# Patient Record
Sex: Female | Born: 1970 | Race: White | Hispanic: No | Marital: Single | Smoking: Current every day smoker
Health system: Southern US, Community
[De-identification: ages and names within clinical notes are randomized; demographics above are authoritative.]

## PROBLEM LIST (undated history)

## (undated) DIAGNOSIS — B182 Chronic viral hepatitis C: Secondary | ICD-10-CM

## (undated) DIAGNOSIS — C801 Malignant (primary) neoplasm, unspecified: Secondary | ICD-10-CM

## (undated) HISTORY — PX: CHOLECYSTECTOMY: SHX55

## (undated) HISTORY — DX: Chronic viral hepatitis C: B18.2

## (undated) HISTORY — DX: Malignant (primary) neoplasm, unspecified: C80.1

---

## 2007-04-18 ENCOUNTER — Ambulatory Visit: Payer: Self-pay | Admitting: Internal Medicine

## 2007-04-21 ENCOUNTER — Ambulatory Visit: Payer: Self-pay | Admitting: Surgery

## 2008-04-25 IMAGING — US ABDOMEN ULTRASOUND
1 series · 17 of 25 positions shown · non-contrast
Comparison: none

REASON FOR EXAM: CALL [id number]   abd pain  attn gallbladder
COMMENTS:

[Series 1: abdomen ultrasound · 17 of 61 slices shown]
[im 1/61]
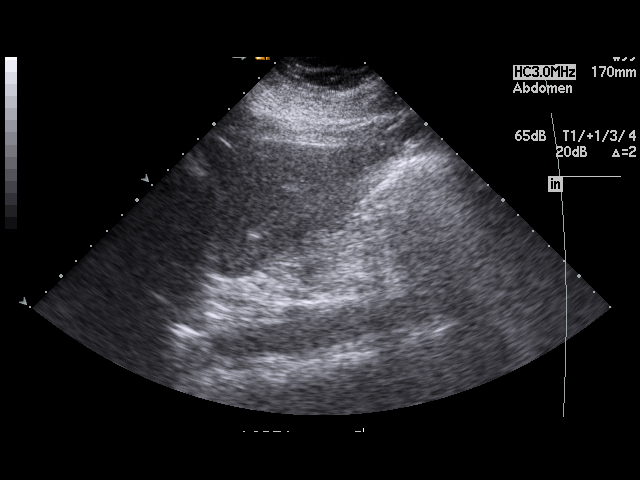
[im 6/61]
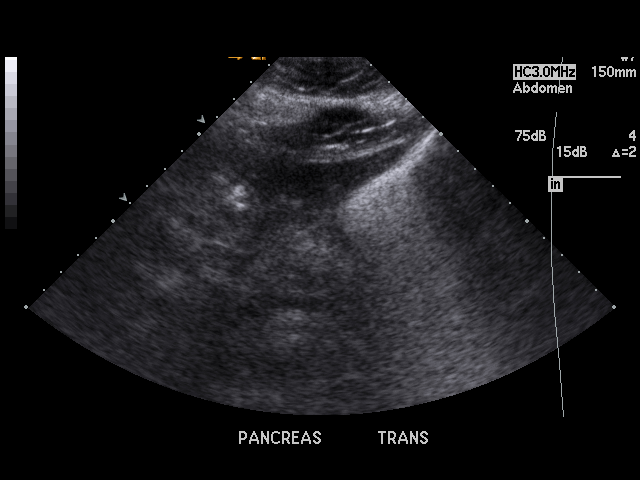
[im 8/61]
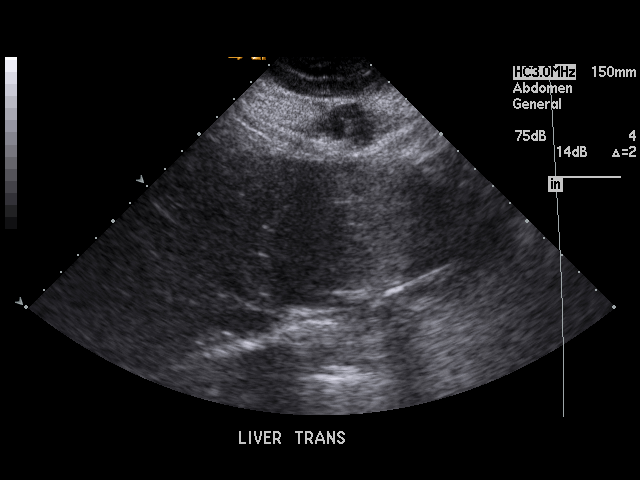
[im 13/61]
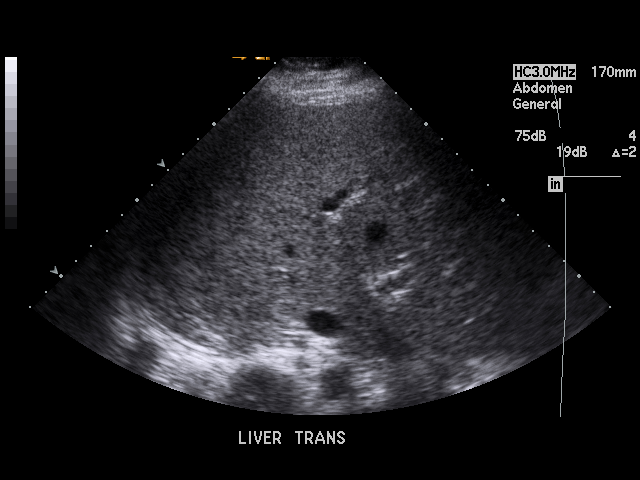
[im 16/61]
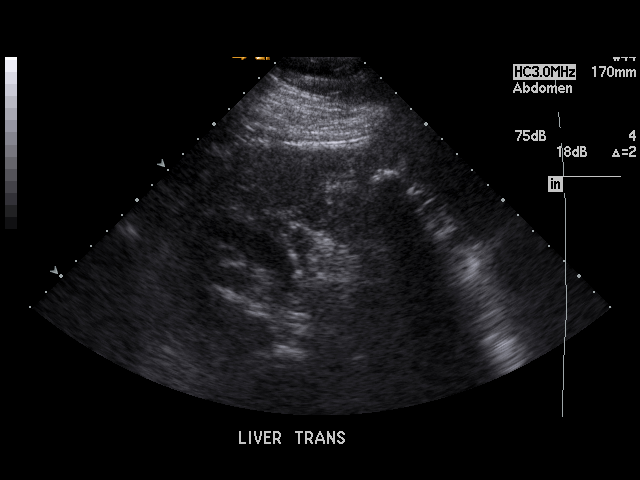
[im 21/61]
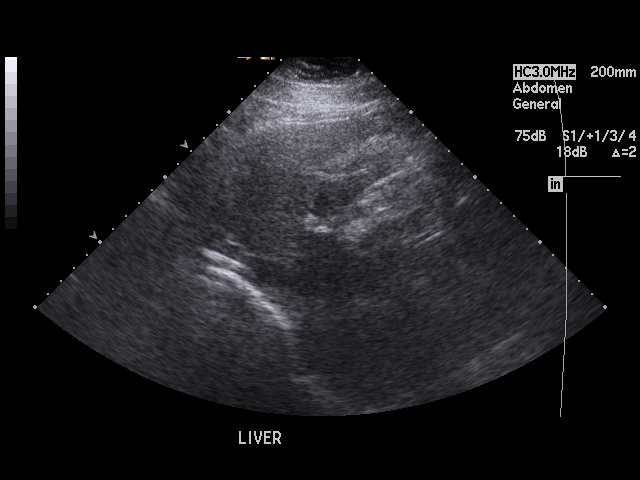
[im 23/61]
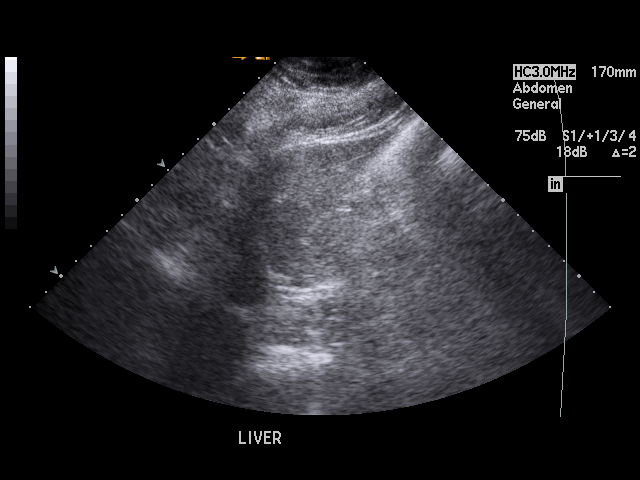
[im 28/61]
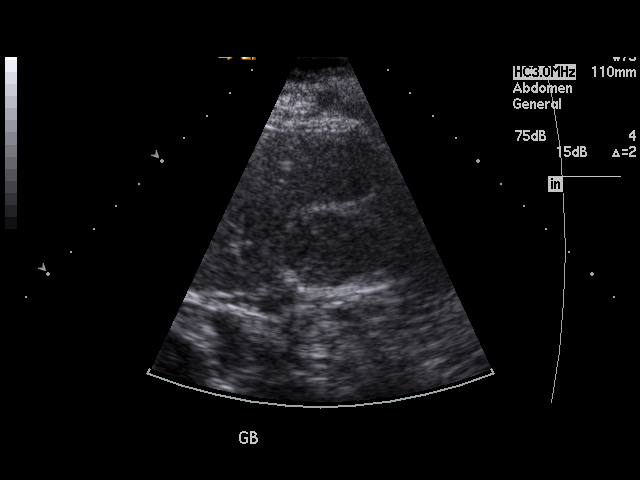
[im 31/61]
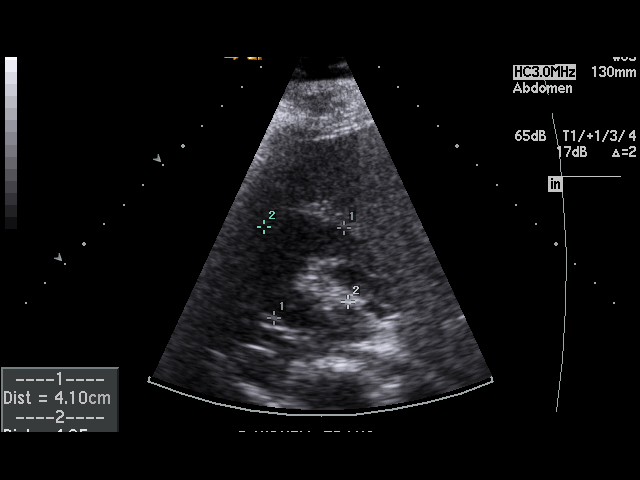
[im 33/61]
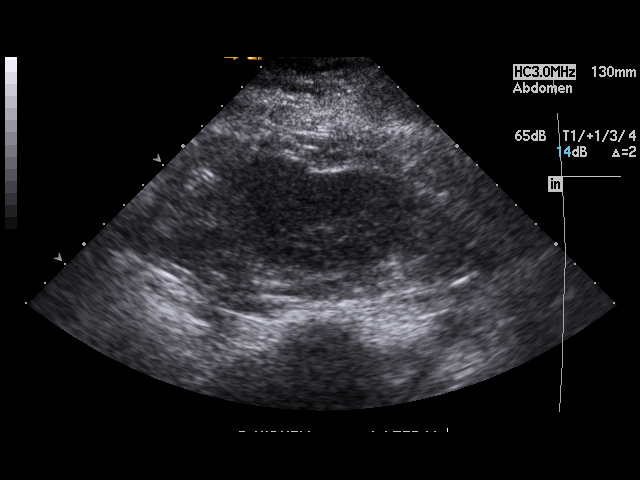
[im 38/61]
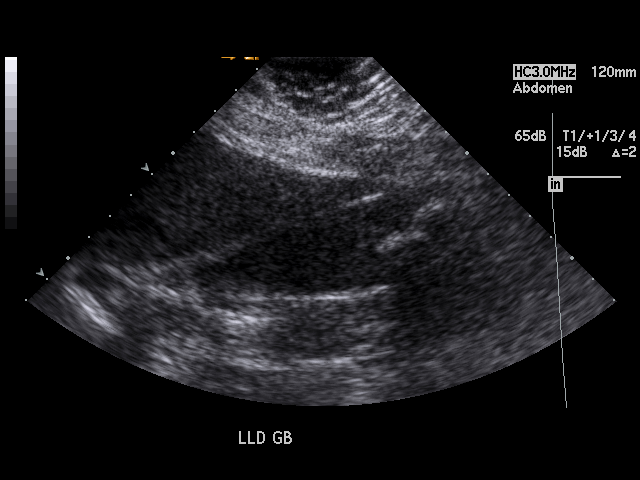
[im 41/61]
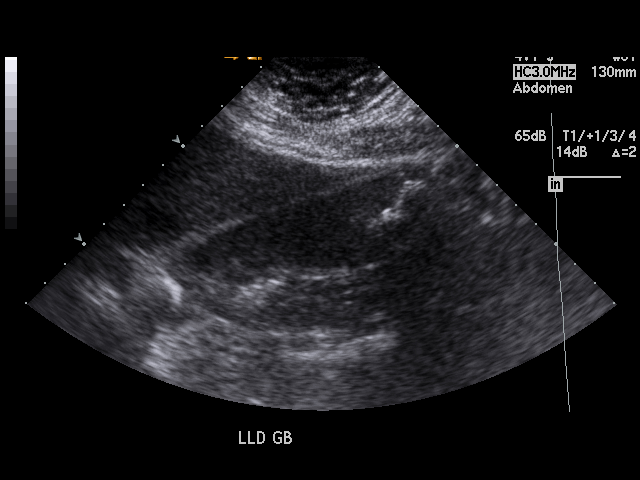
[im 46/61]
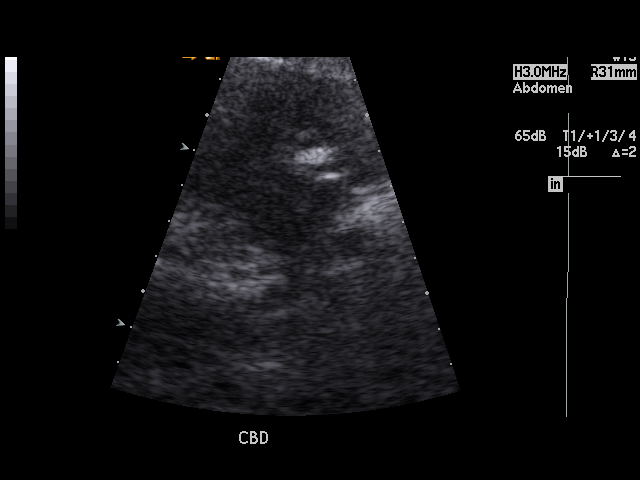
[im 48/61]
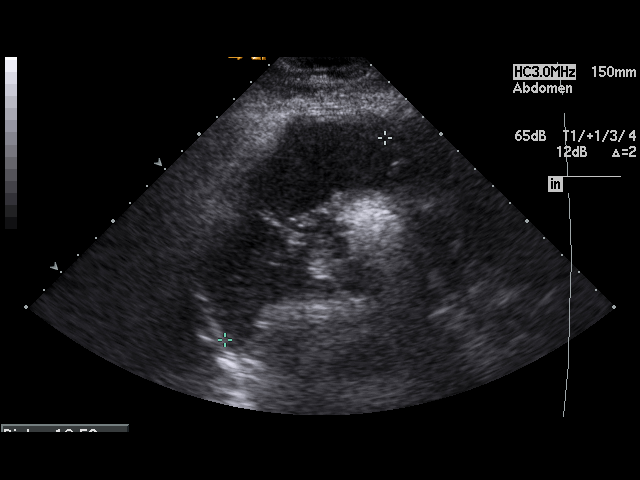
[im 53/61]
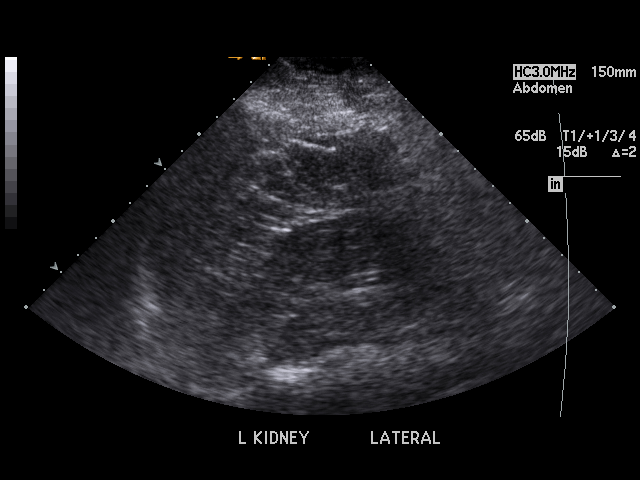
[im 56/61]
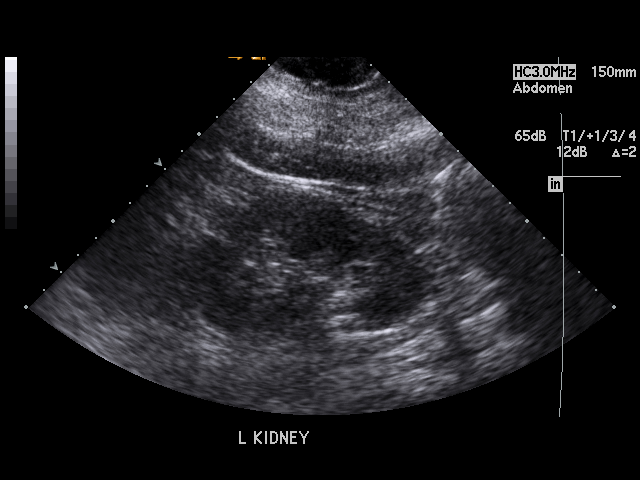
[im 61/61]
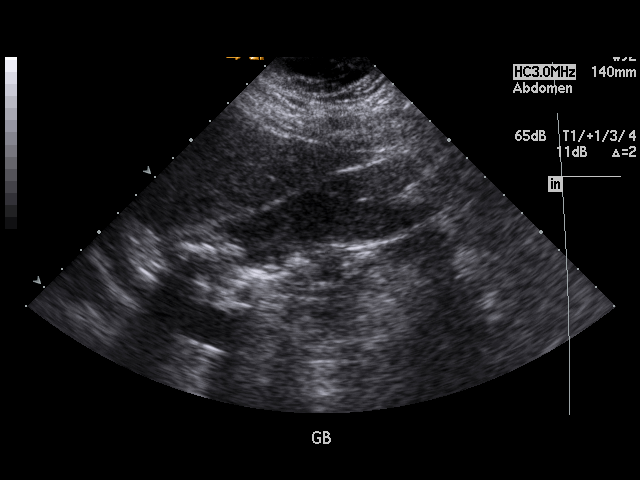

[17 of 25 positions shown; findings below may reference images not displayed]

PROCEDURE:     US  - US ABDOMEN GENERAL SURVEY  - April 18, 2007  [DATE]

RESULT:     Liver exhibits normal echotexture with no evidence of mass or
ductal dilation. Portal venous flow is normal in direction toward the liver.
The gallbladder exhibits echogenic stones as well the presence of sludge.
There is a positive sonographic Murphy's sign. The gallbladder wall does not
appear abnormally thickened nor is there evidence of pericholecystic fluid.
The common bile duct measures 3.6 mm in diameter.

Pancreas could not be demonstrated due to presence of bowel gas. The spleen
and kidneys exhibit no acute abnormality. Limited evaluation the pancreas
reveal no acute abnormality.
IMPRESSION: 1. There are are gallstones or sludge present and there is a positive
sonographic Murphy's sign consistent with acute cholecystitis. I do not see
pericholecystic fluid or gallbladder wall thickening however. There is no
acute abnormality of the liver or common bile duct.
3. The spleen and abdominal aorta could be only partially evaluated.

The findings were called to Dr. [REDACTED] at the conclusion of the study.

## 2011-02-02 ENCOUNTER — Ambulatory Visit: Payer: Self-pay | Admitting: Family Medicine

## 2011-03-07 ENCOUNTER — Ambulatory Visit: Payer: Self-pay | Admitting: Obstetrics and Gynecology

## 2011-03-12 ENCOUNTER — Ambulatory Visit: Payer: Self-pay | Admitting: Obstetrics and Gynecology

## 2011-03-31 ENCOUNTER — Emergency Department: Payer: Self-pay | Admitting: Emergency Medicine

## 2011-04-02 ENCOUNTER — Emergency Department: Payer: Self-pay | Admitting: Emergency Medicine

## 2011-08-16 ENCOUNTER — Ambulatory Visit: Payer: Self-pay | Admitting: Gastroenterology

## 2011-10-19 ENCOUNTER — Ambulatory Visit: Payer: Self-pay | Admitting: Gastroenterology

## 2011-10-22 LAB — PATHOLOGY REPORT

## 2012-05-21 ENCOUNTER — Ambulatory Visit: Payer: Self-pay | Admitting: Family Medicine

## 2012-10-26 IMAGING — US ULTRASOUND CORE BIOPSY
1 series · 11 of 11 positions shown · non-contrast
Comparison: none

REASON FOR EXAM: hepatitis C
COMMENTS:

[Series 1: ultrasound core biopsy · 11 of 11 slices shown]
[im 1/11]
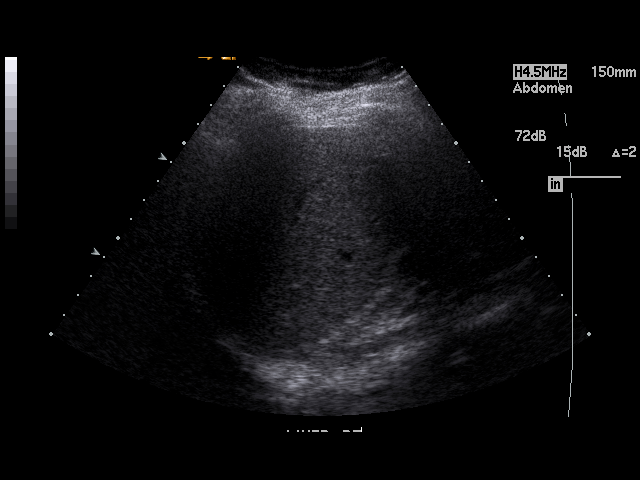
[im 2/11]
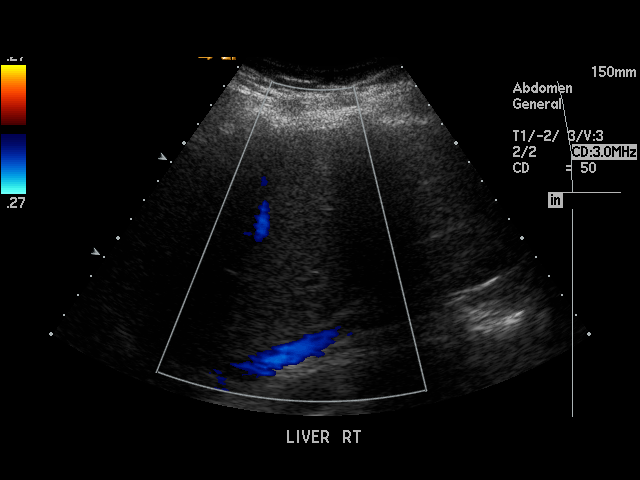
[im 3/11]
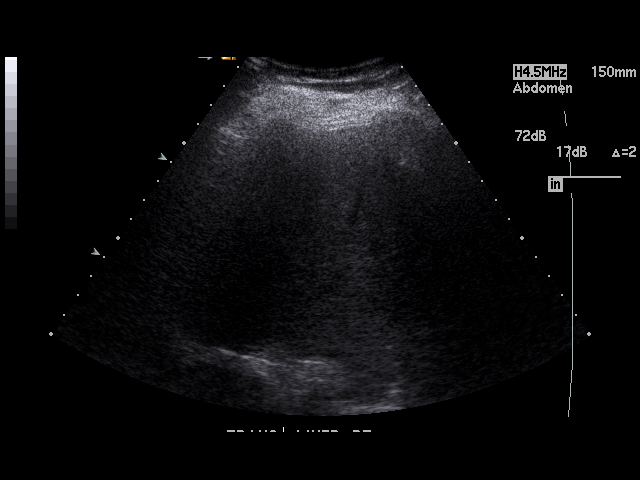
[im 4/11]
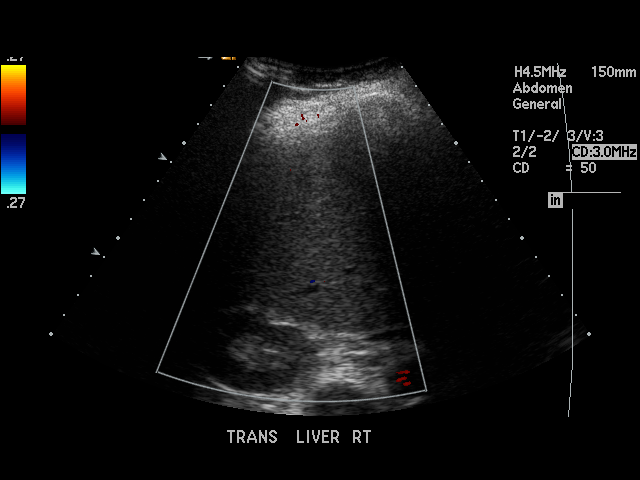
[im 5/11]
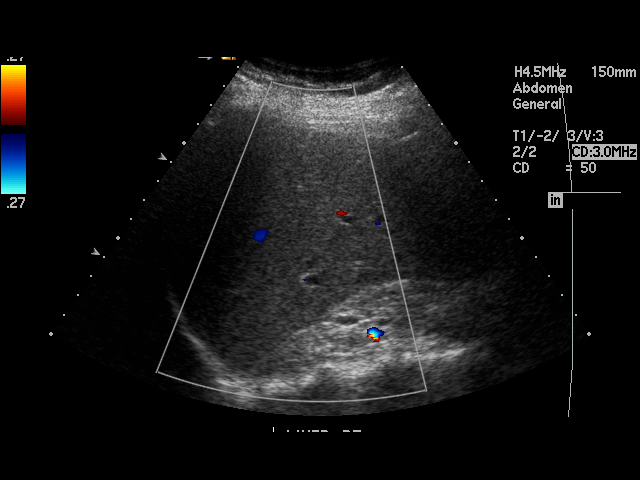
[im 6/11]
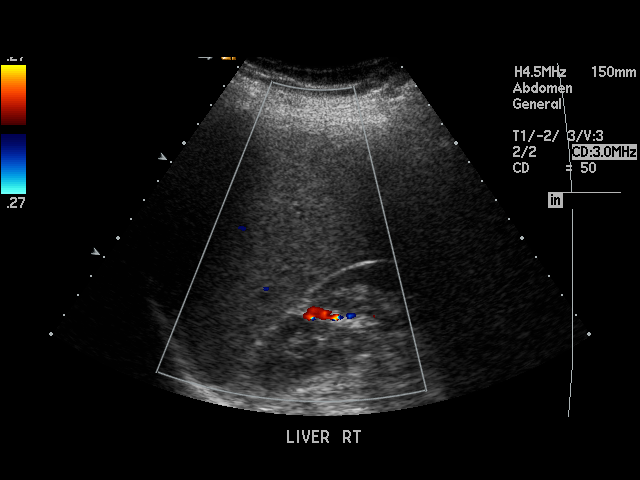
[im 7/11]
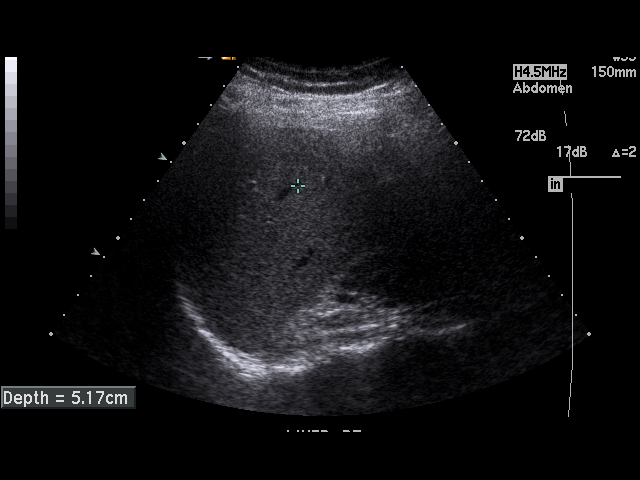
[im 8/11]
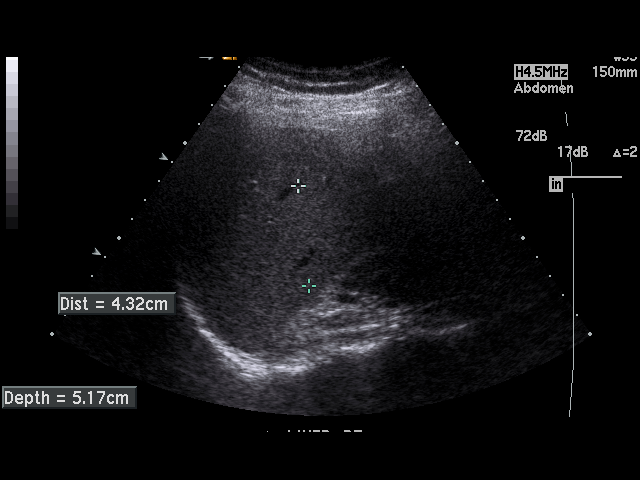
[im 9/11]
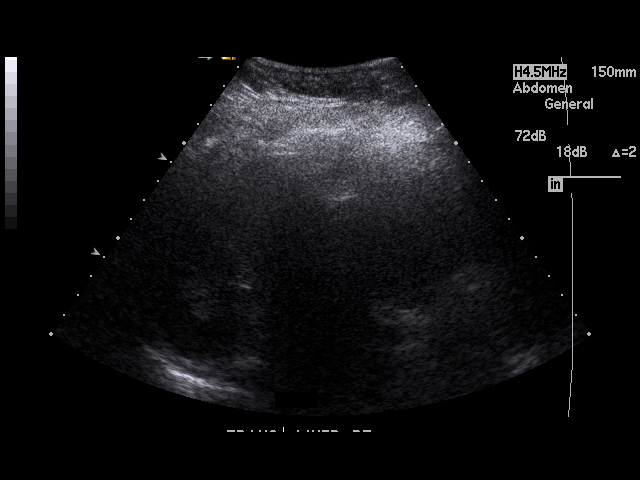
[im 10/11]
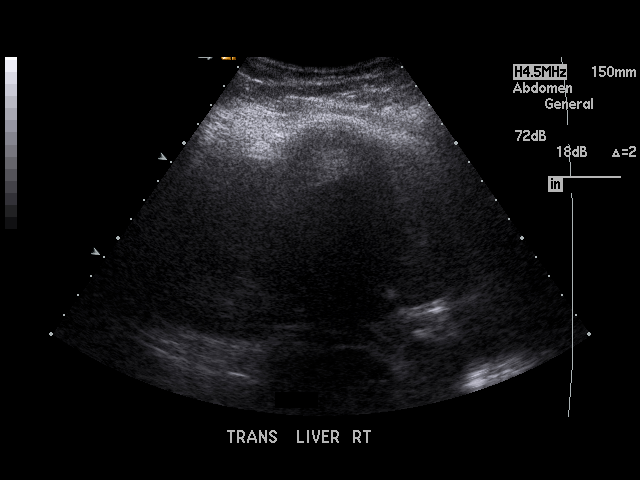
[im 11/11]
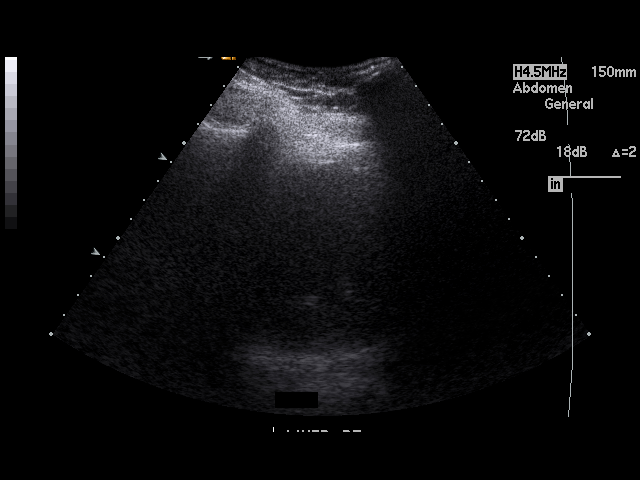

[11 of 11 positions shown; findings below may reference images not displayed]

PROCEDURE:     US  - US GUIDED BX/ASPIRATION NOT BR  - October 19, 2011  [DATE]

RESULT:     Indication:  Hepatitis C.  Ultrasound guided liver biopsy is
requested.

Comparisons:  None

Procedure: Clinical assessment was performed and informed consent obtained.
The patient was brought to the sonography suite and right anterior oblique
supine. A focused right upper quadrant ultrasound was performed which
identified no focal hepatic abnormality.

The right flank and abdomen were prepped and draped in the usual sterile
manner. The skin and planned biopsy tract to the liver capsule were
anesthetized with 5 cc 1% lidocaine. Under ultrasound guidance, one 18 gauge
core needle biopsies of the lesion were obtained and submitted to pathology.

The puncture site was cleansed and dressed with a sterile bandage. The
procedure was well tolerated and without complication.  The patient was
transferred to the recovery unit in stable condition.

Sedation:  1mg midazolam and 70mcg fentanyl
IMPRESSION: Uncomplicated ultrasound guided core liver biopsy of the right hepatic lobe.

## 2015-10-05 ENCOUNTER — Ambulatory Visit (INDEPENDENT_AMBULATORY_CARE_PROVIDER_SITE_OTHER): Payer: 59 | Admitting: Internal Medicine

## 2015-10-05 ENCOUNTER — Encounter: Payer: Self-pay | Admitting: Internal Medicine

## 2015-10-05 VITALS — BP 118/70 | HR 78 | Temp 98.6°F | Ht 64.0 in | Wt 185.0 lb

## 2015-10-05 DIAGNOSIS — K589 Irritable bowel syndrome without diarrhea: Secondary | ICD-10-CM | POA: Diagnosis not present

## 2015-10-05 DIAGNOSIS — B182 Chronic viral hepatitis C: Secondary | ICD-10-CM | POA: Diagnosis not present

## 2015-10-05 MED ORDER — ESCITALOPRAM OXALATE 10 MG PO TABS: 10.0000 mg | ORAL_TABLET | Freq: Every day | ORAL | Status: DC

## 2015-10-05 NOTE — Assessment & Plan Note (Signed)
S/p Harvoni treatment Viral loads undetectable Advised her to follow up with the liver care clinic

## 2015-10-05 NOTE — Assessment & Plan Note (Signed)
Discussed diet modification to see if it helps at all ? R/t stress Will start Lexapro 10 mg daily  RTC in 4 weeks to evaluated effectiveness of therapy

## 2015-10-05 NOTE — Progress Notes (Signed)
Pre visit review using our clinic review tool, if applicable. No additional management support is needed unless otherwise documented below in the visit note. 

## 2015-10-05 NOTE — Progress Notes (Signed)
HPI  Hep C: She reports she did contract this after a blood transfusion in 1991. 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 She took [facility] 12/21/2014. She has not seen the liver care center since 01/2015. She reports she was cured of Hep C.  She is concerned about "upset stomach". She reports about 15 minutes after she eats, she will have an episode of loose stool. It doesn't matter what she eats. She denies abdominal cramping. She denies nausea, vomiting or blood in her stool. She does reports that her diet is not great. She skips breakfast most days. When she does eat, she will have a diet mt dew and pop tart. She skips lunch. For dinner she will have grilled chicken, lettuce and a diet mt dew. She has not taken anything OTC for this.  Flu: 09/2015 Tetanus: unsure Pap Smear: 2013 Mammogram: 2014 Vision Screening: as needed Dentist: as needed  Past Medical History  Diagnosis Date  . Hep C w/o coma, chronic ([place])   . Cancer [facility])     skin cancer s/p surgery    No current outpatient prescriptions on file.   No current facility-administered medications for this visit.    No Known Allergies  Family History  Problem Relation Age of Onset  . Diabetes Father   . Cancer Maternal Grandmother     lung  . Stroke Maternal Grandmother   . Hyperlipidemia Neg Hx   . Hypertension Neg Hx     Social History   Social History  . Marital Status: Single    Spouse Name: N/A  . Number of Children: N/A  . Years of Education: N/A   Occupational History  . Not on file.   Social History Main Topics  . Smoking status: Current Every Day Smoker -- 0.50 packs/day for 30 years    Types: Cigarettes    Start date: 12/24/1978  . Smokeless tobacco: Never Used  . Alcohol Use: 0.0 oz/week    0 Standard drinks or equivalent per week  . Drug Use: No  . Sexual Activity: Not Currently   Other Topics Concern  . Not on file   Social History Narrative  . No narrative on file     ROS:  Constitutional: Denies fever, malaise, fatigue, headache or abrupt weight changes.  HEENT: Denies eye pain, eye redness, ear pain, ringing in the ears, wax buildup, runny nose, nasal congestion, bloody nose, or sore throat. Respiratory: Denies difficulty breathing, shortness of breath, cough or sputum production.   Cardiovascular: Denies chest pain, chest tightness, palpitations or swelling in the hands or feet.  Gastrointestinal: Pt reports diarrhea. Denies abdominal pain, bloating, constipation, or blood in the stool.  GU: Denies frequency, urgency, pain with urination, blood in urine, odor or discharge. Musculoskeletal: Denies decrease in range of motion, difficulty with gait, muscle pain or joint pain and swelling.  Skin: Denies redness, rashes, lesions or ulcercations.  Neurological: Denies dizziness, difficulty with memory, difficulty with speech or problems with balance and coordination.  Psych: Denies anxiety, depression, SI/HI.  No other specific complaints in a complete review of systems (except as listed in HPI above).  PE:  BP 118/70 mmHg  Pulse 78  Temp(Src) 98.6 F (37 C) (Tympanic)  Ht 5\' 4"  (1.626 m)  Wt 185 lb (83.915 kg)  BMI 31.74 kg/m2  SpO2 98% Wt Readings from Last 3 Encounters:  10/05/15 185 lb (83.915 kg)    General: Appears her stated age, obese in NAD. Cardiovascular: Normal rate and  rhythm. S1,S2 noted.  No murmur, rubs or gallops noted.  Pulmonary/Chest: Normal effort and positive vesicular breath sounds. No respiratory distress. No wheezes, rales or ronchi noted.  Abdomen: Soft and nontender. Normal bowel sounds. No distention or masses noted.  Neurological: Alert and oriented.  Psychiatric: Mood and affect normal. Behavior is normal. Judgment and thought content normal.     Assessment and Plan:  Make an appt for your annual exam

## 2015-10-05 NOTE — Patient Instructions (Signed)
Diet for Irritable Bowel Syndrome When you have irritable bowel syndrome (IBS), the foods you eat and your eating habits are very important. IBS may cause various symptoms, such as abdominal pain, constipation, or diarrhea. Choosing the right foods can help ease discomfort caused by these symptoms. Work with your health care provider and dietitian to find the best eating plan to help control your symptoms. WHAT GENERAL GUIDELINES DO I NEED TO FOLLOW?  Keep a food diary. This will help you identify foods that cause symptoms. Write down:  What you eat and when.  What symptoms you have.  When symptoms occur in relation to your meals.  Avoid foods that cause symptoms. Talk with your dietitian about other ways to get the same nutrients that are in these foods.  Eat more foods that contain fiber. Take a fiber supplement if directed by your dietitian.  Eat your meals slowly, in a relaxed setting.  Aim to eat 5-6 small meals per day. Do not skip meals.  Drink enough fluids to keep your urine clear or pale yellow.  Ask your health care provider if you should take an over-the-counter probiotic during flare-ups to help restore healthy gut bacteria.  If you have cramping or diarrhea, try making your meals low in fat and high in carbohydrates. Examples of carbohydrates are pasta, rice, whole grain breads and cereals, fruits, and vegetables.  If dairy products cause your symptoms to flare up, try eating less of them. You might be able to handle yogurt better than other dairy products because it contains bacteria that help with digestion. WHAT FOODS ARE NOT RECOMMENDED? The following are some foods and drinks that may worsen your symptoms:  Fatty foods, such as French fries.  Milk products, such as cheese or ice cream.  Chocolate.  Alcohol.  Products with caffeine, such as coffee.  Carbonated drinks, such as soda. The items listed above may not be a complete list of foods and beverages to  avoid. Contact your dietitian for more information. WHAT FOODS ARE GOOD SOURCES OF FIBER? Your health care provider or dietitian may recommend that you eat more foods that contain fiber. Fiber can help reduce constipation and other IBS symptoms. Add foods with fiber to your diet a little at a time so that your body can get used to them. Too much fiber at once might cause gas and swelling of your abdomen. The following are some foods that are good sources of fiber:  Apples.  Peaches.  Pears.  Berries.  Figs.  Broccoli (raw).  Cabbage.  Carrots.  Raw peas.  Kidney beans.  Lima beans.  Whole grain bread.  Whole grain cereal. FOR MORE INFORMATION  International Foundation for Functional Gastrointestinal Disorders: www.iffgd.org National Institute of Diabetes and Digestive and Kidney Diseases: www.niddk.nih.gov/health-information/health-topics/digestive-diseases/ibs/Pages/facts.aspx   This information is not intended to replace advice given to you by your health care provider. Make sure you discuss any questions you have with your health care provider.   Document Released: 03/01/2004 Document Revised: 12/31/2014 Document Reviewed: 03/12/2014 Elsevier Interactive Patient Education 2016 [employer].  

## 2015-11-09 ENCOUNTER — Ambulatory Visit (INDEPENDENT_AMBULATORY_CARE_PROVIDER_SITE_OTHER): Payer: 59 | Admitting: Internal Medicine

## 2015-11-09 ENCOUNTER — Encounter: Payer: Self-pay | Admitting: Internal Medicine

## 2015-11-09 ENCOUNTER — Other Ambulatory Visit (HOSPITAL_COMMUNITY)
Admission: RE | Admit: 2015-11-09 | Discharge: 2015-11-09 | Disposition: A | Discharge status: Home / Self Care | Payer: 59 | Source: Ambulatory Visit | Attending: Internal Medicine | Admitting: Internal Medicine

## 2015-11-09 VITALS — BP 116/72 | HR 71 | Temp 97.8°F | Ht 63.33 in | Wt 184.0 lb

## 2015-11-09 DIAGNOSIS — Z124 Encounter for screening for malignant neoplasm of cervix: Secondary | ICD-10-CM

## 2015-11-09 DIAGNOSIS — R799 Abnormal finding of blood chemistry, unspecified: Secondary | ICD-10-CM

## 2015-11-09 DIAGNOSIS — K589 Irritable bowel syndrome without diarrhea: Secondary | ICD-10-CM

## 2015-11-09 DIAGNOSIS — Z1239 Encounter for other screening for malignant neoplasm of breast: Secondary | ICD-10-CM | POA: Diagnosis not present

## 2015-11-09 DIAGNOSIS — Z01411 Encounter for gynecological examination (general) (routine) with abnormal findings: Secondary | ICD-10-CM | POA: Insufficient documentation

## 2015-11-09 DIAGNOSIS — Z0000 Encounter for general adult medical examination without abnormal findings: Secondary | ICD-10-CM | POA: Diagnosis not present

## 2015-11-09 DIAGNOSIS — Z1151 Encounter for screening for human papillomavirus (HPV): Secondary | ICD-10-CM | POA: Insufficient documentation

## 2015-11-09 DIAGNOSIS — B182 Chronic viral hepatitis C: Secondary | ICD-10-CM

## 2015-11-09 NOTE — Addendum Note (Signed)
Addended by: Lurlean Nanny on: 11/09/2015 05:44 PM   Modules accepted: Orders

## 2015-11-09 NOTE — Progress Notes (Signed)
Pre visit review using our clinic review tool, if applicable. No additional management support is needed unless otherwise documented below in the visit note. 

## 2015-11-09 NOTE — Progress Notes (Signed)
Subjective:    Patient ID: Cynthia Mcguire, female    DOB: August 18, 1971, 44 y.o.   MRN: [id number]  HPI  Pt presents to the clinic today for her annual exam. She is also due to f/u IBS. She was started on Lexapro to see if that would help her symptoms at all. She reports she is only taking the Lexapro 2-3 times per week. She does feel like the medication makes her tired and she has not noticed any improvement in her symptoms.  Flu: 09/2015 Tetanus: unsure but she thinks it was in 2014 LMP: Pap Smear: 2013 Mammogram: 2014 Vision Screening: as needed Dentist: as needed  Diet: Exercise:  Review of Systems      Past Medical History  Diagnosis Date  . Hep C w/o coma, chronic ([place])   . Cancer [facility])     skin cancer s/p surgery    Current Outpatient Prescriptions  Medication Sig Dispense Refill  . escitalopram (LEXAPRO) 10 MG tablet Take 1 tablet (10 mg total) by mouth daily. 30 tablet 2   No current facility-administered medications for this visit.    No Known Allergies  Family History  Problem Relation Age of Onset  . Diabetes Father   . Cancer Maternal Grandmother     lung  . Stroke Maternal Grandmother   . Hyperlipidemia Neg Hx   . Hypertension Neg Hx     Social History   Social History  . Marital Status: Single    Spouse Name: N/A  . Number of Children: N/A  . Years of Education: N/A   Occupational History  . Not on file.   Social History Main Topics  . Smoking status: Current Every Day Smoker -- 0.50 packs/day for 30 years    Types: Cigarettes    Start date: 12/24/1978  . Smokeless tobacco: Never Used  . Alcohol Use: 0.0 oz/week    0 Standard drinks or equivalent per week  . Drug Use: No  . Sexual Activity: Not Currently   Other Topics Concern  . Not on file   Social History Narrative     Constitutional: Denies fever, malaise, fatigue, headache or abrupt weight changes.  HEENT: Denies eye pain, eye redness, ear pain, ringing in the ears,  wax buildup, runny nose, nasal congestion, bloody nose, or sore throat. Respiratory: Denies difficulty breathing, shortness of breath, cough or sputum production.   Cardiovascular: Denies chest pain, chest tightness, palpitations or swelling in the hands or feet.  Gastrointestinal: Pt reports diarrhea. Denies abdominal pain, bloating, constipation,  or blood in the stool.  GU: Denies urgency, frequency, pain with urination, burning sensation, blood in urine, odor or discharge. Musculoskeletal: Denies decrease in range of motion, difficulty with gait, muscle pain or joint pain and swelling.  Skin: Denies redness, rashes, lesions or ulcercations.  Neurological: Denies dizziness, difficulty with memory, difficulty with speech or problems with balance and coordination.  Psych: Denies anxiety, depression, SI/HI.  No other specific complaints in a complete review of systems (except as listed in HPI above).  Objective:   Physical Exam   BP 116/72 mmHg  Pulse 71  Temp(Src) 97.8 F (36.6 C) (Oral)  Ht 5' 3.33" (1.609 m)  Wt 184 lb (83.462 kg)  BMI 32.24 kg/m2  SpO2 98% Wt Readings from Last 3 Encounters:  11/09/15 184 lb (83.462 kg)  10/05/15 185 lb (83.915 kg)    General: Appears her stated age, in NAD. Skin: Warm, dry and intact. Large scar to left posterior back.  HEENT: Head: normal shape and size; Eyes: sclera white, no icterus, conjunctiva pink, PERRLA and EOMs intact; Ears: Tm's gray and intact, normal light reflex;  Throat/Mouth: Teeth present, mucosa pink and moist, no exudate, lesions or ulcerations noted.  Neck:  Neck supple, trachea midline. No masses, lumps or thyromegaly present.  Cardiovascular: Normal rate and rhythm. S1,S2 noted.  No murmur, rubs or gallops noted.  No carotid bruits noted. Pulmonary/Chest: Normal effort and positive vesicular breath sounds. No respiratory distress. No wheezes, rales or ronchi noted.  Abdomen: Soft and nontender. Normal bowel sounds. No  distention or masses noted. Liver, spleen and kidneys non palpable. Pelvic: Normal female anatomy. Cervical changes noted at 12 oclock. No discharge noted. No CMT. Adnexa non palpable. Musculoskeletal: Strength 5/5 BUE/BLE. No signs of joint swelling. No difficulty with gait.  Neurological: Alert and oriented. Cranial nerves II-XII grossly  intact. Coordination normal.  Psychiatric: Mood and affect normal. Behavior is normal. Judgment and thought content normal.        Assessment & Plan:   Preventative Health Maintenance:  Encouraged her to consume a balanced diet and exercise regimen Flu shot UTD Declines Tetanus today Pap smear today Mammogram ordered- she will call to schedule Encouraged her to see an eye doctor and dentist annually CBC, CMET, Lipid, FSH/LH, Vit D today  RTC in 1 year or sooner if needed

## 2015-11-09 NOTE — Addendum Note (Signed)
Addended by: Lurlean Nanny on: 11/09/2015 07:05 PM   Modules accepted: Orders

## 2015-11-09 NOTE — Patient Instructions (Signed)
Health Maintenance, Female Adopting a healthy lifestyle and getting preventive care can go a long way to promote health and wellness. Talk with your health care provider about what schedule of regular examinations is right for you. This is a good chance for you to check in with your provider about disease prevention and staying healthy. In between checkups, there are plenty of things you can do on your own. Experts have done a lot of research about which lifestyle changes and preventive measures are most likely to keep you healthy. Ask your health care provider for more information. WEIGHT AND DIET  Eat a healthy diet  Be sure to include plenty of vegetables, fruits, low-fat dairy products, and lean protein.  Do not eat a lot of foods high in solid fats, added sugars, or salt.  Get regular exercise. This is one of the most important things you can do for your health.  Most adults should exercise for at least 150 minutes each week. The exercise should increase your heart rate and make you sweat (moderate-intensity exercise).  Most adults should also do strengthening exercises at least twice a week. This is in addition to the moderate-intensity exercise.  Maintain a healthy weight  Body mass index (BMI) is a measurement that can be used to identify possible weight problems. It estimates body fat based on height and weight. Your health care provider can help determine your BMI and help you achieve or maintain a healthy weight.  For females 20 years of age and older:   A BMI below 18.5 is considered underweight.  A BMI of 18.5 to 24.9 is normal.  A BMI of 25 to 29.9 is considered overweight.  A BMI of 30 and above is considered obese.  Watch levels of cholesterol and blood lipids  You should start having your blood tested for lipids and cholesterol at 46 years of age, then have this test every 5 years.  You may need to have your cholesterol levels checked more often if:  Your lipid  or cholesterol levels are high.  You are older than 46 years of age.  You are at high risk for heart disease.  CANCER SCREENING   Lung Cancer  Lung cancer screening is recommended for adults 55-80 years old who are at high risk for lung cancer because of a history of smoking.  A yearly low-dose CT scan of the lungs is recommended for people who:  Currently smoke.  Have quit within the past 15 years.  Have at least a 30-pack-year history of smoking. A pack year is smoking an average of one pack of cigarettes a day for 1 year.  Yearly screening should continue until it has been 15 years since you quit.  Yearly screening should stop if you develop a health problem that would prevent you from having lung cancer treatment.  Breast Cancer  Practice breast self-awareness. This means understanding how your breasts normally appear and feel.  It also means doing regular breast self-exams. Let your health care provider know about any changes, no matter how small.  If you are in your 20s or 30s, you should have a clinical breast exam (CBE) by a health care provider every 1-3 years as part of a regular health exam.  If you are 40 or older, have a CBE every year. Also consider having a breast X-ray (mammogram) every year.  If you have a family history of breast cancer, talk to your health care provider about genetic screening.  If you   are at high risk for breast cancer, talk to your health care provider about having an MRI and a mammogram every year.  Breast cancer gene (BRCA) assessment is recommended for women who have family members with BRCA-related cancers. BRCA-related cancers include:  Breast.  Ovarian.  Tubal.  Peritoneal cancers.  Results of the assessment will determine the need for genetic counseling and BRCA1 and BRCA2 testing. Cervical Cancer Your health care provider may recommend that you be screened regularly for cancer of the pelvic organs (ovaries, uterus, and  vagina). This screening involves a pelvic examination, including checking for microscopic changes to the surface of your cervix (Pap test). You may be encouraged to have this screening done every 3 years, beginning at age 21.  For women ages 30-65, health care providers may recommend pelvic exams and Pap testing every 3 years, or they may recommend the Pap and pelvic exam, combined with testing for human papilloma virus (HPV), every 5 years. Some types of HPV increase your risk of cervical cancer. Testing for HPV may also be done on women of any age with unclear Pap test results.  Other health care providers may not recommend any screening for nonpregnant women who are considered low risk for pelvic cancer and who do not have symptoms. Ask your health care provider if a screening pelvic exam is right for you.  If you have had past treatment for cervical cancer or a condition that could lead to cancer, you need Pap tests and screening for cancer for at least 20 years after your treatment. If Pap tests have been discontinued, your risk factors (such as having a new sexual partner) need to be reassessed to determine if screening should resume. Some women have medical problems that increase the chance of getting cervical cancer. In these cases, your health care provider may recommend more frequent screening and Pap tests. Colorectal Cancer  This type of cancer can be detected and often prevented.  Routine colorectal cancer screening usually begins at 46 years of age and continues through 46 years of age.  Your health care provider may recommend screening at an earlier age if you have risk factors for colon cancer.  Your health care provider may also recommend using home test kits to check for hidden blood in the stool.  A small camera at the end of a tube can be used to examine your colon directly (sigmoidoscopy or colonoscopy). This is done to check for the earliest forms of colorectal  cancer.  Routine screening usually begins at age 50.  Direct examination of the colon should be repeated every 5-10 years through 46 years of age. However, you may need to be screened more often if early forms of precancerous polyps or small growths are found. Skin Cancer  Check your skin from head to toe regularly.  Tell your health care provider about any new moles or changes in moles, especially if there is a change in a mole's shape or color.  Also tell your health care provider if you have a mole that is larger than the size of a pencil eraser.  Always use sunscreen. Apply sunscreen liberally and repeatedly throughout the day.  Protect yourself by wearing long sleeves, pants, a wide-brimmed hat, and sunglasses whenever you are outside. HEART DISEASE, DIABETES, AND HIGH BLOOD PRESSURE   High blood pressure causes heart disease and increases the risk of stroke. High blood pressure is more likely to develop in:  People who have blood pressure in the high end   of the normal range (130-139/85-89 mm Hg).  People who are overweight or obese.  People who are African American.  If you are 38-23 years of age, have your blood pressure checked every 3-5 years. If you are 61 years of age or older, have your blood pressure checked every year. You should have your blood pressure measured twice--once when you are at a hospital or clinic, and once when you are not at a hospital or clinic. Record the average of the two measurements. To check your blood pressure when you are not at a hospital or clinic, you can use:  An automated blood pressure machine at a pharmacy.  A home blood pressure monitor.  If you are between 45 years and 39 years old, ask your health care provider if you should take aspirin to prevent strokes.  Have regular diabetes screenings. This involves taking a blood sample to check your fasting blood sugar level.  If you are at a normal weight and have a low risk for diabetes,  have this test once every three years after 46 years of age.  If you are overweight and have a high risk for diabetes, consider being tested at a younger age or more often. PREVENTING INFECTION  Hepatitis B  If you have a higher risk for hepatitis B, you should be screened for this virus. You are considered at high risk for hepatitis B if:  You were born in a country where hepatitis B is common. Ask your health care provider which countries are considered high risk.  Your parents were born in a high-risk country, and you have not been immunized against hepatitis B (hepatitis B vaccine).  You have HIV or AIDS.  You use needles to inject street drugs.  You live with someone who has hepatitis B.  You have had sex with someone who has hepatitis B.  You get hemodialysis treatment.  You take certain medicines for conditions, including cancer, organ transplantation, and autoimmune conditions. Hepatitis C  Blood testing is recommended for:  Everyone born from [birth year] through 1967.  Anyone with known risk factors for hepatitis C. Sexually transmitted infections (STIs)  You should be screened for sexually transmitted infections (STIs) including gonorrhea and chlamydia if:  You are sexually active and are younger than 46 years of age.  You are older than 46 years of age and your health care provider tells you that you are at risk for this type of infection.  Your sexual activity has changed since you were last screened and you are at an increased risk for chlamydia or gonorrhea. Ask your health care provider if you are at risk.  If you do not have HIV, but are at risk, it may be recommended that you take a prescription medicine daily to prevent HIV infection. This is called pre-exposure prophylaxis (PrEP). You are considered at risk if:  You are sexually active and do not regularly use condoms or know the HIV status of your partner(s).  You take drugs by injection.  You are sexually  active with a partner who has HIV. Talk with your health care provider about whether you are at high risk of being infected with HIV. If you choose to begin PrEP, you should first be tested for HIV. You should then be tested every 3 months for as long as you are taking PrEP.  PREGNANCY   If you are premenopausal and you may become pregnant, ask your health care provider about preconception counseling.  If you may  become pregnant, take 400 to 800 micrograms (mcg) of folic acid every day.  If you want to prevent pregnancy, talk to your health care provider about birth control (contraception). OSTEOPOROSIS AND MENOPAUSE   Osteoporosis is a disease in which the bones lose minerals and strength with aging. This can result in serious bone fractures. Your risk for osteoporosis can be identified using a bone density scan.  If you are 61 years of age or older, or if you are at risk for osteoporosis and fractures, ask your health care provider if you should be screened.  Ask your health care provider whether you should take a calcium or vitamin D supplement to lower your risk for osteoporosis.  Menopause may have certain physical symptoms and risks.  Hormone replacement therapy may reduce some of these symptoms and risks. Talk to your health care provider about whether hormone replacement therapy is right for you.  HOME CARE INSTRUCTIONS   Schedule regular health, dental, and eye exams.  Stay current with your immunizations.   Do not use any tobacco products including cigarettes, chewing tobacco, or electronic cigarettes.  If you are pregnant, do not drink alcohol.  If you are breastfeeding, limit how much and how often you drink alcohol.  Limit alcohol intake to no more than 1 drink per day for nonpregnant women. One drink equals 12 ounces of beer, 5 ounces of wine, or 1 ounces of hard liquor.  Do not use street drugs.  Do not share needles.  Ask your health care provider for help if  you need support or information about quitting drugs.  Tell your health care provider if you often feel depressed.  Tell your health care provider if you have ever been abused or do not feel safe at home.   This information is not intended to replace advice given to you by your health care provider. Make sure you discuss any questions you have with your health care provider.   Document Released: 06/25/2011 Document Revised: 12/31/2014 Document Reviewed: 11/11/2013 Elsevier Interactive Patient Education [employer].

## 2015-11-09 NOTE — Assessment & Plan Note (Signed)
Advised her to start taking Lexapro daily

## 2015-11-09 NOTE — Assessment & Plan Note (Signed)
Will check HCV quant today

## 2015-11-10 LAB — LIPID PANEL
CHOL/HDL RATIO: 5
Cholesterol: 159 mg/dL (ref 0–200)
HDL: 31.7 mg/dL — AB (ref >39.00)
NONHDL: 127.2
Triglycerides: 204 mg/dL — ABNORMAL HIGH (ref 0.0–149.0)
VLDL: 40.8 mg/dL — ABNORMAL HIGH (ref 0.0–40.0)

## 2015-11-10 LAB — VITAMIN D 25 HYDROXY (VIT D DEFICIENCY, FRACTURES): VITD: 26.03 ng/mL — ABNORMAL LOW (ref 30.00–100.00)

## 2015-11-10 LAB — COMPREHENSIVE METABOLIC PANEL
ALT: 13 U/L (ref 0–35)
AST: 15 U/L (ref 0–37)
Albumin: 4.2 g/dL (ref 3.5–5.2)
Alkaline Phosphatase: 76 U/L (ref 39–117)
BUN: 10 mg/dL (ref 6–23)
CHLORIDE: 103 meq/L (ref 96–112)
CO2: 28 meq/L (ref 19–32)
CREATININE: 0.81 mg/dL (ref 0.40–1.20)
Calcium: 9.9 mg/dL (ref 8.4–10.5)
GFR: 80.95 mL/min (ref >60.00)
Glucose, Bld: 113 mg/dL — ABNORMAL HIGH (ref 70–99)
Potassium: 3.8 mEq/L (ref 3.5–5.1)
Sodium: 140 mEq/L (ref 135–145)
Total Bilirubin: 0.5 mg/dL (ref 0.2–1.2)
Total Protein: 7.3 g/dL (ref 6.0–8.3)

## 2015-11-10 LAB — CBC
HCT: 40.4 % (ref 36.0–46.0)
HEMOGLOBIN: 12.6 g/dL (ref 12.0–15.0)
MCHC: 31.3 g/dL (ref 30.0–36.0)
MCV: 64.3 fl — AB (ref 78.0–100.0)
PLATELETS: 312 10*3/uL (ref 150.0–400.0)
RBC: 6.29 Mil/uL — ABNORMAL HIGH (ref 3.87–5.11)
RDW: 16.4 % — ABNORMAL HIGH (ref 11.5–15.5)
WBC: 10.2 10*3/uL (ref 4.0–10.5)

## 2015-11-10 LAB — FOLLICLE STIMULATING HORMONE: FSH: 20.8 m[IU]/mL

## 2015-11-10 LAB — LDL CHOLESTEROL, DIRECT: Direct LDL: 113 mg/dL

## 2015-11-10 LAB — LUTEINIZING HORMONE: LH: 13.16 m[IU]/mL

## 2015-11-11 LAB — HEPATITIS C RNA QUANTITATIVE: HCV Quantitative: NOT DETECTED IU/mL (ref <15)

## 2015-11-14 LAB — CYTOLOGY - PAP

## 2016-01-10 DIAGNOSIS — J209 Acute bronchitis, unspecified: Secondary | ICD-10-CM | POA: Diagnosis not present

## 2016-01-16 ENCOUNTER — Encounter: Payer: Self-pay | Admitting: Internal Medicine

## 2016-01-16 ENCOUNTER — Ambulatory Visit (INDEPENDENT_AMBULATORY_CARE_PROVIDER_SITE_OTHER): Payer: 59 | Admitting: Internal Medicine

## 2016-01-16 VITALS — BP 114/78 | HR 70 | Temp 98.2°F | Wt 188.0 lb

## 2016-01-16 DIAGNOSIS — R05 Cough: Secondary | ICD-10-CM | POA: Diagnosis not present

## 2016-01-16 DIAGNOSIS — J069 Acute upper respiratory infection, unspecified: Secondary | ICD-10-CM | POA: Diagnosis not present

## 2016-01-16 DIAGNOSIS — R059 Cough, unspecified: Secondary | ICD-10-CM

## 2016-01-16 MED ORDER — PROMETHAZINE-DM 6.25-15 MG/5ML PO SYRP: ORAL_SOLUTION | ORAL | Status: DC

## 2016-01-16 NOTE — Progress Notes (Signed)
Subjective:    Patient ID: Cynthia Mcguire, female    DOB: 02/02/1971, 45 y.o.   MRN: [id number]  HPI  Pt presents to the clinic today with c/o nasal congestion and cough. This started 2 weeks ago. The cough is productive of green mucous. The cough is keeping her awake at night. She does have some right side chest wall pain when taking a deep breath. She is mildly short of breath with coughing. She has had fatigue, chills and no energy. She does have some associated stress incontinence. She has not had any tried anything OTC. She has no history of seasonal allergies. She has had sick contacts. She was seen at a UC 1/17 for the same. They gave her Azithromax and Promethazine-Dextromethorphan cough syrup with some relief. She does continue to smoke.  Review of Systems      Past Medical History  Diagnosis Date  . Hep C w/o coma, chronic ([place])   . Cancer [facility])     skin cancer s/p surgery    Current Outpatient Prescriptions  Medication Sig Dispense Refill  . escitalopram (LEXAPRO) 10 MG tablet Take 1 tablet (10 mg total) by mouth daily. 30 tablet 2   No current facility-administered medications for this visit.    No Known Allergies  Family History  Problem Relation Age of Onset  . Diabetes Father   . Cancer Maternal Grandmother     lung  . Stroke Maternal Grandmother   . Hyperlipidemia Neg Hx   . Hypertension Neg Hx     Social History   Social History  . Marital Status: Single    Spouse Name: N/A  . Number of Children: N/A  . Years of Education: N/A   Occupational History  . Not on file.   Social History Main Topics  . Smoking status: Current Every Day Smoker -- 0.50 packs/day for 30 years    Types: Cigarettes    Start date: 12/24/1978  . Smokeless tobacco: Never Used  . Alcohol Use: 0.0 oz/week    0 Standard drinks or equivalent per week  . Drug Use: No  . Sexual Activity: Not Currently   Other Topics Concern  . Not on file   Social History Narrative      Constitutional: Denies fever, malaise, fatigue, headache or abrupt weight changes.  HEENT: Pt reports nasal congestion. Denies eye pain, eye redness, ear pain, ringing in the ears, wax buildup, runny nose, bloody nose, or sore throat. Respiratory: Pt reports cough and mild shortness of breath. Denies difficulty breathing, or sputum production.   Cardiovascular: Denies chest pain, chest tightness, palpitations or swelling in the hands or feet.   No other specific complaints in a complete review of systems (except as listed in HPI above).  Objective:   Physical Exam   BP 114/78 mmHg  Pulse 70  Temp(Src) 98.2 F (36.8 C) (Oral)  Wt 188 lb (85.276 kg)  SpO2 98% Wt Readings from Last 3 Encounters:  01/16/16 188 lb (85.276 kg)  11/09/15 184 lb (83.462 kg)  10/05/15 185 lb (83.915 kg)    General: Appears her stated age, in NAD. HEENT: Head: normal shape and size, no sinus tenderness noted; Eyes: sclera white, no icterus, conjunctiva pink; Ears: Tm's gray and intact, normal light reflex; Nose: mucosa pink and moist, septum midline; Throat/Mouth: Teeth present, mucosa pink and moist, no exudate, lesions or ulcerations noted.  Neck:  No adenopathy noted. Cardiovascular: Normal rate and rhythm. S1,S2 noted.  Pulmonary/Chest: Normal effort and  positive vesicular breath sounds. No respiratory distress. No wheezes, rales or ronchi noted.   BMET    Component Value Date/Time   NA 140 11/09/2015 1518   K 3.8 11/09/2015 1518   CL 103 11/09/2015 1518   CO2 28 11/09/2015 1518   GLUCOSE 113* 11/09/2015 1518   BUN 10 11/09/2015 1518   CREATININE 0.81 11/09/2015 1518   CALCIUM 9.9 11/09/2015 1518    Lipid Panel     Component Value Date/Time   CHOL 159 11/09/2015 1518   TRIG 204.0* 11/09/2015 1518   HDL 31.70* 11/09/2015 1518   CHOLHDL 5 11/09/2015 1518   VLDL 40.8* 11/09/2015 1518    CBC    Component Value Date/Time   WBC 10.2 11/09/2015 1518   RBC 6.29* 11/09/2015 1518    HGB 12.6 11/09/2015 1518   HCT 40.4 11/09/2015 1518   PLT 312.0 11/09/2015 1518   MCV 64.3* 11/09/2015 1518   MCHC 31.3 11/09/2015 1518   RDW 16.4* 11/09/2015 1518    Hgb A1C No results found for: HGBA1C      Assessment & Plan:   Post infectious cough:  Advised her that Malen Gauze is continuing to work She declines RX for [employer] Refilled Promethazine-Dextromethorphan cough syrup Return precautions given  RTC as needed or if symptoms persist or worsen

## 2016-01-16 NOTE — Patient Instructions (Signed)

## 2016-01-16 NOTE — Progress Notes (Signed)
Pre visit review using our clinic review tool, if applicable. No additional management support is needed unless otherwise documented below in the visit note. 

## 2016-05-30 ENCOUNTER — Encounter: Payer: Self-pay | Admitting: Internal Medicine

## 2016-05-30 ENCOUNTER — Ambulatory Visit (INDEPENDENT_AMBULATORY_CARE_PROVIDER_SITE_OTHER): Payer: 59 | Admitting: Internal Medicine

## 2016-05-30 VITALS — BP 116/80 | HR 80 | Temp 98.8°F | Wt 188.0 lb

## 2016-05-30 DIAGNOSIS — J069 Acute upper respiratory infection, unspecified: Secondary | ICD-10-CM | POA: Diagnosis not present

## 2016-05-30 MED ORDER — AZITHROMYCIN 250 MG PO TABS: ORAL_TABLET | ORAL | Status: DC

## 2016-05-30 MED ORDER — HYDROCODONE-HOMATROPINE 5-1.5 MG/5ML PO SYRP: 5.0000 mL | ORAL_SOLUTION | Freq: Three times a day (TID) | ORAL | Status: DC | PRN

## 2016-05-30 NOTE — Progress Notes (Signed)
HPI  Pt presents to the clinic today with c/o nasal congestion, sore throat and cough. This started 2 weeks ago. She is blowing clear mucous out of her nose. The cough is productive of green mucous. She denies fever, but has had chills. She denies shortness of breath. She has taken Mucinex and Nyquil with minimal relief. She has no history of seasonal allergies. She has not had sick contacts. She does smoke.   Review of Systems      Past Medical History  Diagnosis Date  . 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 Cancer [facility])     skin cancer s/p surgery    Family History  Problem Relation Age of Onset  . Diabetes Father   . Cancer Maternal Grandmother     lung  . Stroke Maternal Grandmother   . Hyperlipidemia Neg Hx   . Hypertension Neg Hx     Social History   Social History  . Marital Status: Single    Spouse Name: N/A  . Number of Children: N/A  . Years of Education: N/A   Occupational History  . Not on file.   Social History Main Topics  . Smoking status: Current Every Day Smoker -- 0.50 packs/day for 30 years    Types: Cigarettes    Start date: 12/24/1978  . Smokeless tobacco: Never Used  . Alcohol Use: 0.0 oz/week    0 Standard drinks or equivalent per week  . Drug Use: No  . Sexual Activity: Not Currently   Other Topics Concern  . Not on file   Social History Narrative    No Known Allergies   Constitutional: Denies headache, fatigue, fever or abrupt weight changes.  HEENT:  Positive nasal congestion, sore throat. Denies eye redness, eye pain, pressure behind the eyes, facial pain, ear pain, ringing in the ears, wax buildup, runny nose or bloody nose. Respiratory: Positive cough. Denies difficulty breathing or shortness of breath.  Cardiovascular: Denies chest pain, chest tightness, palpitations or swelling in the hands or feet.   No other specific complaints in a complete review of systems (except as listed in HPI above).  Objective:   BP 116/80 mmHg   Pulse 80  Temp(Src) 98.8 F (37.1 C) (Oral)  Wt 188 lb (85.276 kg)  SpO2 98% Wt Readings from Last 3 Encounters:  05/30/16 188 lb (85.276 kg)  01/16/16 188 lb (85.276 kg)  11/09/15 184 lb (83.462 kg)     General: Appears her stated age, obese in NAD. HEENT: Head: normal shape and size, no sinus tenderness noted; Eyes: sclera white, no icterus, conjunctiva pink; Ears: Tm's red and intact, normal light reflex; Nose: mucosa pink and moist, septum midline; Throat/Mouth: + PND. Teeth present, mucosa pink and moist, no exudate noted, no lesions or ulcerations noted.  Neck: No cervical lymphadenopathy.  Cardiovascular: Normal rate and rhythm. S1,S2 noted.  No murmur, rubs or gallops noted.  Pulmonary/Chest: Normal effort and fine rhonchi noted in RUL/RLL. No respiratory distress. No wheezes, rales or ronchi noted.      Assessment & Plan:   Upper Respiratory Infection:  Get some rest and drink plenty of water Do salt water gargles for the sore throat eRx for Azithromax x 5 days Rx for Hycodan cough syrup  RTC as needed or if symptoms persist.

## 2016-05-30 NOTE — Progress Notes (Signed)
Pre visit review using our clinic review tool, if applicable. No additional management support is needed unless otherwise documented below in the visit note. 

## 2016-05-30 NOTE — Patient Instructions (Signed)
Upper Respiratory Infection, Adult Most upper respiratory infections (URIs) are a viral infection of the air passages leading to the lungs. A URI affects the nose, throat, and upper air passages. The most common type of URI is nasopharyngitis and is typically referred to as "the common cold." URIs run their course and usually go away on their own. Most of the time, a URI does not require medical attention, but sometimes a bacterial infection in the upper airways can follow a viral infection. This is called a secondary infection. Sinus and middle ear infections are common types of secondary upper respiratory infections. Bacterial pneumonia can also complicate a URI. A URI can worsen asthma and chronic obstructive pulmonary disease (COPD). Sometimes, these complications can require emergency medical care and may be life threatening.  CAUSES Almost all URIs are caused by viruses. A virus is a type of germ and can spread from one person to another.  RISKS FACTORS You may be at risk for a URI if:   You smoke.   You have chronic heart or lung disease.  You have a weakened defense (immune) system.   You are very young or very old.   You have nasal allergies or asthma.  You work in crowded or poorly ventilated areas.  You work in health care facilities or schools. SIGNS AND SYMPTOMS  Symptoms typically develop 2-3 days after you come in contact with a cold virus. Most viral URIs last 7-10 days. However, viral URIs from the influenza virus (flu virus) can last 14-18 days and are typically more severe. Symptoms may include:   Runny or stuffy (congested) nose.   Sneezing.   Cough.   Sore throat.   Headache.   Fatigue.   Fever.   Loss of appetite.   Pain in your forehead, behind your eyes, and over your cheekbones (sinus pain).  Muscle aches.  DIAGNOSIS  Your health care provider may diagnose a URI by:  Physical exam.  Tests to check that your symptoms are not due to  another condition such as:  Strep throat.  Sinusitis.  Pneumonia.  Asthma. TREATMENT  A URI goes away on its own with time. It cannot be cured with medicines, but medicines may be prescribed or recommended to relieve symptoms. Medicines may help:  Reduce your fever.  Reduce your cough.  Relieve nasal congestion. HOME CARE INSTRUCTIONS   Take medicines only as directed by your health care provider.   Gargle warm saltwater or take cough drops to comfort your throat as directed by your health care provider.  Use a warm mist humidifier or inhale steam from a shower to increase air moisture. This may make it easier to breathe.  Drink enough fluid to keep your urine clear or pale yellow.   Eat soups and other clear broths and maintain good nutrition.   Rest as needed.   Return to work when your temperature has returned to normal or as your health care provider advises. You may need to stay home longer to avoid infecting others. You can also use a face mask and careful hand washing to prevent spread of the virus.  Increase the usage of your inhaler if you have asthma.   Do not use any tobacco products, including cigarettes, chewing tobacco, or electronic cigarettes. If you need help quitting, ask your health care provider. PREVENTION  The best way to protect yourself from getting a cold is to practice good hygiene.   Avoid oral or hand contact with people with cold   symptoms.   Wash your hands often if contact occurs.  There is no clear evidence that vitamin C, vitamin E, echinacea, or exercise reduces the chance of developing a cold. However, it is always recommended to get plenty of rest, exercise, and practice good nutrition.  SEEK MEDICAL CARE IF:   You are getting worse rather than better.   Your symptoms are not controlled by medicine.   You have chills.  You have worsening shortness of breath.  You have brown or red mucus.  You have yellow or brown nasal  discharge.  You have pain in your face, especially when you bend forward.  You have a fever.  You have swollen neck glands.  You have pain while swallowing.  You have white areas in the back of your throat. SEEK IMMEDIATE MEDICAL CARE IF:   You have severe or persistent:  Headache.  Ear pain.  Sinus pain.  Chest pain.  You have chronic lung disease and any of the following:  Wheezing.  Prolonged cough.  Coughing up blood.  A change in your usual mucus.  You have a stiff neck.  You have changes in your:  Vision.  Hearing.  Thinking.  Mood. MAKE SURE YOU:   Understand these instructions.  Will watch your condition.  Will get help right away if you are not doing well or get worse.   This information is not intended to replace advice given to you by your health care provider. Make sure you discuss any questions you have with your health care provider.   Document Released: 06/05/2001 Document Revised: 04/26/2015 Document Reviewed: 03/17/2014 Elsevier Interactive Patient Education 2016 Elsevier Inc.  

## 2016-11-12 ENCOUNTER — Encounter: Payer: 59 | Admitting: Internal Medicine

## 2020-01-12 ENCOUNTER — Other Ambulatory Visit: Payer: Self-pay

## 2020-01-12 NOTE — Progress Notes (Signed)
Prescreened patient for BCCCP appointment schedule 01/13/20.  Denies any breast problems.  States had normal pap performed 2 weeks ago at [facility].  Will request results to scan in chart.   Risk Assessment    Risk Scores      01/13/2020   Last edited by: Theodore Demark, RN   5-year risk: 0.9 %   Lifetime risk: 8 %

## 2020-01-13 ENCOUNTER — Ambulatory Visit: Payer: Self-pay | Attending: Oncology | Admitting: *Deleted

## 2020-01-13 ENCOUNTER — Ambulatory Visit
Admission: RE | Admit: 2020-01-13 | Discharge: 2020-01-13 | Disposition: A | Discharge status: Home / Self Care | Payer: Self-pay | Source: Ambulatory Visit | Attending: Oncology | Admitting: Oncology

## 2020-01-13 ENCOUNTER — Encounter: Payer: Self-pay | Admitting: *Deleted

## 2020-01-13 DIAGNOSIS — Z0000 Encounter for general adult medical examination without abnormal findings: Secondary | ICD-10-CM | POA: Insufficient documentation

## 2020-01-13 NOTE — Progress Notes (Signed)
  Subjective:     Patient ID: Cynthia Mcguire, female   DOB: 15-Jan-1971, 49 y.o.   MRN: [id number]  HPI   Review of Systems     Objective:   Physical Exam     Assessment:     Due to Covid 19 pandemic a televisit was used to enroll patient into our BCCCP program and obtain health history.  Verbal consent given.  Patient denies and breast problems.  Last pap smear collected this month by her PCP.  Patient went directly to the breast center today for her screening mammogram.   Risk Assessment    Risk Scores      01/13/2020   Last edited by: Theodore Demark, RN   5-year risk: 0.9 %   Lifetime risk: 8 %            Plan:     Screening mammogram ordered.  Will follow up per BCCCP protocol.

## 2020-01-15 ENCOUNTER — Encounter: Payer: Self-pay | Admitting: *Deleted

## 2020-01-15 NOTE — Progress Notes (Signed)
Letter mailed from the [facility] to inform patient of her normal mammogram results.  Patient is to follow-up with annual screening in one year. 

## 2020-01-25 ENCOUNTER — Ambulatory Visit: Payer: Self-pay | Admitting: Urology

## 2020-02-22 ENCOUNTER — Other Ambulatory Visit: Payer: Self-pay

## 2020-02-22 ENCOUNTER — Encounter: Payer: Self-pay | Admitting: Urology

## 2020-02-22 ENCOUNTER — Ambulatory Visit (INDEPENDENT_AMBULATORY_CARE_PROVIDER_SITE_OTHER): Payer: Self-pay | Admitting: Urology

## 2020-02-22 VITALS — BP 148/84 | HR 73 | Ht 63.33 in | Wt 172.0 lb

## 2020-02-22 DIAGNOSIS — R3129 Other microscopic hematuria: Secondary | ICD-10-CM

## 2020-02-22 DIAGNOSIS — R319 Hematuria, unspecified: Secondary | ICD-10-CM

## 2020-02-22 LAB — URINALYSIS, COMPLETE
Bilirubin, UA: NEGATIVE
Glucose, UA: NEGATIVE
Ketones, UA: NEGATIVE
Leukocytes,UA: NEGATIVE
Nitrite, UA: NEGATIVE
Protein,UA: NEGATIVE
Specific Gravity, UA: >1.03 — ABNORMAL HIGH (ref 1.005–1.030)
Urobilinogen, Ur: 0.2 mg/dL (ref 0.2–1.0)
pH, UA: 5 (ref 5.0–7.5)

## 2020-02-22 LAB — MICROSCOPIC EXAMINATION

## 2020-02-22 NOTE — Progress Notes (Signed)
02/22/2020 10:32 AM   Cynthia Mcguire 06/11/1971 [id number]  Referring provider: Jearld Fenton, NP [address] [place],  [place] [address]  Chief Complaint  Patient presents with  . Hematuria    HPI: I was consulted to assist the patient's microscopic hematuria found twice.  She has a smoking history.  No daily aspirin or blood thinner.  She gets bladder infections that clear with cranberry.  She leaks with coughing sneezing bending lifting.  No urge incontinence.  No bedwetting like she coughs.  3 pads a day that are damp or dry and is quite fastidious.  She voids every 2-3 hours and gets up twice while sleeping to urinate.  Flow was weak but she feels empty  No neurologic issues.  Diet-related urgency associated bowel movements     PMH: Past Medical History:  Diagnosis Date  . Cancer [facility])    skin cancer s/p surgery  . Hep C w/o coma, chronic (HCC)     Surgical History: Past Surgical History:  Procedure Laterality Date  . CESAREAN SECTION    . CHOLECYSTECTOMY      Home Medications:  Allergies as of 02/22/2020   No Known Allergies     Medication List       Accurate as of February 22, 2020 10:32 AM. If you have any questions, ask your nurse or doctor.        STOP taking these medications   azithromycin 250 MG tablet Commonly known as: ZITHROMAX Stopped by: Reece Packer, MD   HYDROcodone-homatropine 5-1.5 MG/5ML syrup Commonly known as: HYCODAN Stopped by: Reece Packer, MD     TAKE these medications   escitalopram 10 MG tablet Commonly known as: LEXAPRO Take by mouth.       Allergies: No Known Allergies  Family History: Family History  Problem Relation Age of Onset  . Diabetes Father   . Cancer Maternal Grandmother        lung  . Stroke Maternal Grandmother   . Hyperlipidemia Neg Hx   . Hypertension Neg Hx   . Breast cancer Neg Hx     Social History:  reports that she has been smoking cigarettes. She started  smoking about 41 years ago. She has a 15.00 pack-year smoking history. She has never used smokeless tobacco. She reports current alcohol use. She reports that she does not use drugs.  ROS:                                        Physical Exam: BP (!) 148/84   Pulse 73   Ht 5' 3.33" (1.609 m)   Wt 172 lb (78 kg)   BMI 30.15 kg/m   Constitutional:  Alert and oriented, No acute distress. HEENT: [place] AT, moist mucus membranes.  Trachea midline, no masses. Cardiovascular: No clubbing, cyanosis, or edema. Respiratory: Normal respiratory effort, no increased work of breathing. GI: Abdomen is soft, nontender, nondistended, no abdominal masses GU: Alert Skin: No rashes, bruises or suspicious lesions. Lymph: No cervical or inguinal adenopathy. Neurologic: Grossly intact, no focal deficits, moving all 4 extremities. Psychiatric: Normal mood and affect.  Laboratory Data: Lab Results  Component Value Date   WBC 10.2 11/09/2015   HGB 12.6 11/09/2015   HCT 40.4 11/09/2015   MCV 64.3 (L) 11/09/2015   PLT 312.0 11/09/2015    Lab Results  Component Value Date  CREATININE 0.81 11/09/2015    No results found for: PSA  No results found for: TESTOSTERONE  No results found for: HGBA1C  Urinalysis No results found for: COLORURINE, APPEARANCEUR, LABSPEC, PHURINE, GLUCOSEU, HGBUR, BILIRUBINUR, KETONESUR, PROTEINUR, UROBILINOGEN, NITRITE, LEUKOCYTESUR  Pertinent Imaging:   Assessment & Plan: Work-up for microscopic hematuria described.  No microscopic hematuria today.  Reviewed chart.  Urinalysis reviewed and sent for culture.  She has mild stress incontinence and mild frequency and nocturia.  She is having insurance issues and will apply to Medicaid.  She wants to proceed which is a good idea.  We will discuss her mild incontinence next time with treatment goals and we both agreed  1. Hematuria, unspecified type  - Urinalysis, Complete   No follow-ups on  file.  Reece Packer, MD  [facility] [address] [place], [place] [address] [phone]

## 2020-02-25 LAB — CULTURE, URINE COMPREHENSIVE

## 2020-03-11 ENCOUNTER — Ambulatory Visit
Admission: RE | Admit: 2020-03-11 | Discharge: 2020-03-11 | Disposition: A | Discharge status: Home / Self Care | Payer: Self-pay | Source: Ambulatory Visit | Attending: Urology | Admitting: Urology

## 2020-03-11 ENCOUNTER — Other Ambulatory Visit: Payer: Self-pay

## 2020-03-11 DIAGNOSIS — R319 Hematuria, unspecified: Secondary | ICD-10-CM | POA: Insufficient documentation

## 2020-03-11 MED ORDER — IOHEXOL 300 MG/ML  SOLN
125.0000 mL | Freq: Once | INTRAMUSCULAR | Status: AC | PRN
  Administered 2020-03-11: 125 mL via INTRAVENOUS

## 2020-03-14 ENCOUNTER — Encounter: Payer: Self-pay | Admitting: Urology

## 2020-03-14 ENCOUNTER — Ambulatory Visit (INDEPENDENT_AMBULATORY_CARE_PROVIDER_SITE_OTHER): Payer: Self-pay | Admitting: Urology

## 2020-03-14 ENCOUNTER — Other Ambulatory Visit: Payer: Self-pay

## 2020-03-14 VITALS — BP 122/76 | HR 66 | Wt 172.0 lb

## 2020-03-14 DIAGNOSIS — R3129 Other microscopic hematuria: Secondary | ICD-10-CM

## 2020-03-14 LAB — MICROSCOPIC EXAMINATION

## 2020-03-14 LAB — URINALYSIS, COMPLETE
Bilirubin, UA: NEGATIVE
Glucose, UA: NEGATIVE
Ketones, UA: NEGATIVE
Leukocytes,UA: NEGATIVE
Nitrite, UA: NEGATIVE
Protein,UA: NEGATIVE
Specific Gravity, UA: >1.03 — ABNORMAL HIGH (ref 1.005–1.030)
Urobilinogen, Ur: 0.2 mg/dL (ref 0.2–1.0)
pH, UA: 5.5 (ref 5.0–7.5)

## 2020-03-14 NOTE — Progress Notes (Signed)
03/14/2020 10:52 AM   Cynthia Mcguire 05-09-1971 [id number]  Referring provider: Bunnie Pion, [place],  [place] [address]  Chief Complaint  Patient presents with  . Cysto    HPI: I was consulted to assist the patient's microscopic hematuria found twice.  She has a smoking history.  No daily aspirin or blood thinner.  She gets bladder infections that clear with cranberry.  She leaks with coughing sneezing bending lifting.  No urge incontinence.  No bedwetting like she coughs.  3 pads a day that are damp or dry and is quite fastidious.  She voids every 2-3 hours and gets up twice while sleeping to urinate.  Work-up for microscopic hematuria described.  No microscopic hematuria today.  Reviewed chart.  Urinalysis reviewed and sent for culture.  She has mild stress incontinence and mild frequency and nocturia.  She is having insurance issues and will apply to Medicaid.  She wants to proceed which is a good idea.  We will discuss her mild incontinence next time with treatment goals and we both agreed  Day Frequency stable.  Stress incontinence stable.  CT scan normal. On pelvic examination patient had grade 1 cystocele.  She had mild hypermobility the bladder neck and a negative cough test. Cystoscopy: Patient underwent flexible cystoscopy utilizing sterile technique.  Bladder mucosa and trigone were normal.  No mucosal abnormalities.  I had a very thorough look throughout the bladder and there is no carcinoma.  She tolerated the procedure well   PMH: Past Medical History:  Diagnosis Date  . Cancer [facility])    skin cancer s/p surgery  . Hep C w/o coma, chronic (HCC)     Surgical History: Past Surgical History:  Procedure Laterality Date  . CESAREAN SECTION    . CHOLECYSTECTOMY      Home Medications:  Allergies as of 03/14/2020   No Known Allergies     Medication List       Accurate as of March 14, 2020 10:52 AM. If you have any questions,  ask your nurse or doctor.        STOP taking these medications   escitalopram 10 MG tablet Commonly known as: LEXAPRO Stopped by: Reece Packer, MD       Allergies: No Known Allergies  Family History: Family History  Problem Relation Age of Onset  . Diabetes Father   . Cancer Maternal Grandmother        lung  . Stroke Maternal Grandmother   . Hyperlipidemia Neg Hx   . Hypertension Neg Hx   . Breast cancer Neg Hx     Social History:  reports that she has been smoking cigarettes. She started smoking about 41 years ago. She has a 15.00 pack-year smoking history. She has never used smokeless tobacco. She reports current alcohol use. She reports that she does not use drugs.  ROS:                                        Physical Exam: BP 122/76   Pulse 66   Wt 78 kg   BMI 30.15 kg/m   Constitutional:  Alert and oriented, No acute distress.  Laboratory Data: Lab Results  Component Value Date   WBC 10.2 11/09/2015   HGB 12.6 11/09/2015   HCT 40.4 11/09/2015   MCV 64.3 (L) 11/09/2015   PLT 312.0 11/09/2015  Lab Results  Component Value Date   CREATININE 0.81 11/09/2015    No results found for: PSA  No results found for: TESTOSTERONE  No results found for: HGBA1C  Urinalysis    Component Value Date/Time   APPEARANCEUR Cloudy (A) 02/22/2020 0958   GLUCOSEU Negative 02/22/2020 0958   BILIRUBINUR Negative 02/22/2020 0958   PROTEINUR Negative 02/22/2020 0958   NITRITE Negative 02/22/2020 0958   LEUKOCYTESUR Negative 02/22/2020 0958    Pertinent Imaging:   Assessment & Plan: Patient has benign microscopic hematuria.  She has mild stress incontinence.  Work-up described.  Role of physical therapy described.  Insurance will be difficult likely for the latter.  She will continue to Kegel exercises.  See as needed.  1. Microscopic hematuria  - Urinalysis, Complete   No follow-ups on file.  Reece Packer,  MD  [facility] [address] [place],  [address] [phone]

## 2020-03-21 ENCOUNTER — Ambulatory Visit: Payer: Self-pay

## 2021-01-13 ENCOUNTER — Other Ambulatory Visit: Payer: Self-pay

## 2021-01-13 ENCOUNTER — Ambulatory Visit (LOCAL_COMMUNITY_HEALTH_CENTER): Payer: Self-pay

## 2021-01-13 DIAGNOSIS — Z111 Encounter for screening for respiratory tuberculosis: Secondary | ICD-10-CM

## 2021-01-16 ENCOUNTER — Ambulatory Visit (LOCAL_COMMUNITY_HEALTH_CENTER): Payer: Self-pay

## 2021-01-16 ENCOUNTER — Other Ambulatory Visit: Payer: Self-pay

## 2021-01-16 DIAGNOSIS — Z111 Encounter for screening for respiratory tuberculosis: Secondary | ICD-10-CM

## 2021-01-16 LAB — TB SKIN TEST
Induration: 0 mm
TB Skin Test: NEGATIVE

## 2021-01-20 IMAGING — MG DIGITAL SCREENING BILAT W/ TOMO W/ CAD
8 series · 8 of 24 positions shown · non-contrast
Comparison: Previous exam(s).

ACR Breast Density Category a: The breast tissue is almost entirely
fatty.

CLINICAL DATA: Screening.

EXAM:
DIGITAL SCREENING BILATERAL MAMMOGRAM WITH TOMO AND CAD

[R MLO synth-2D]
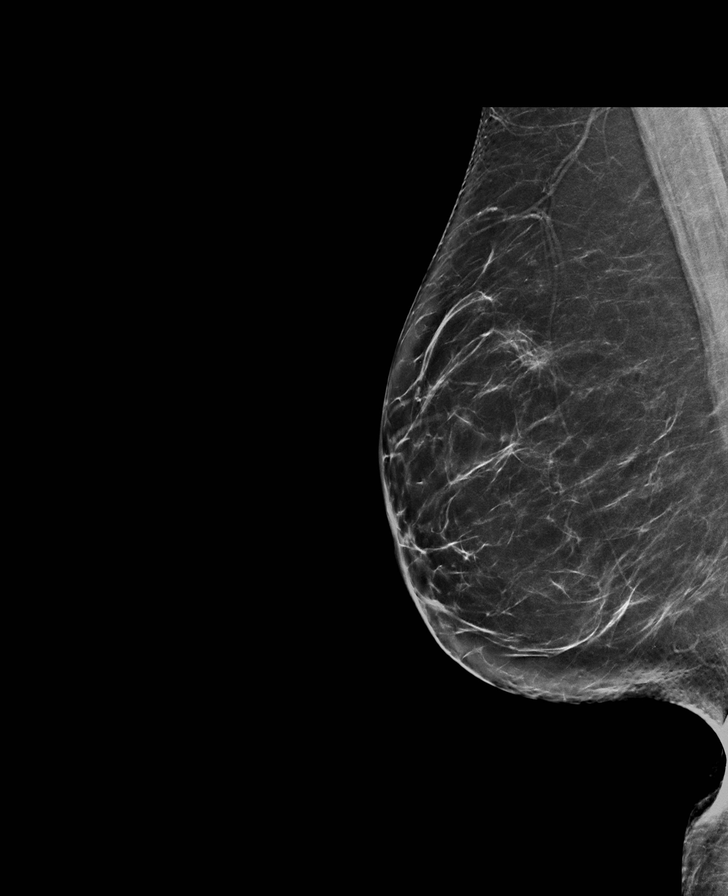

[L MLO synth-2D]
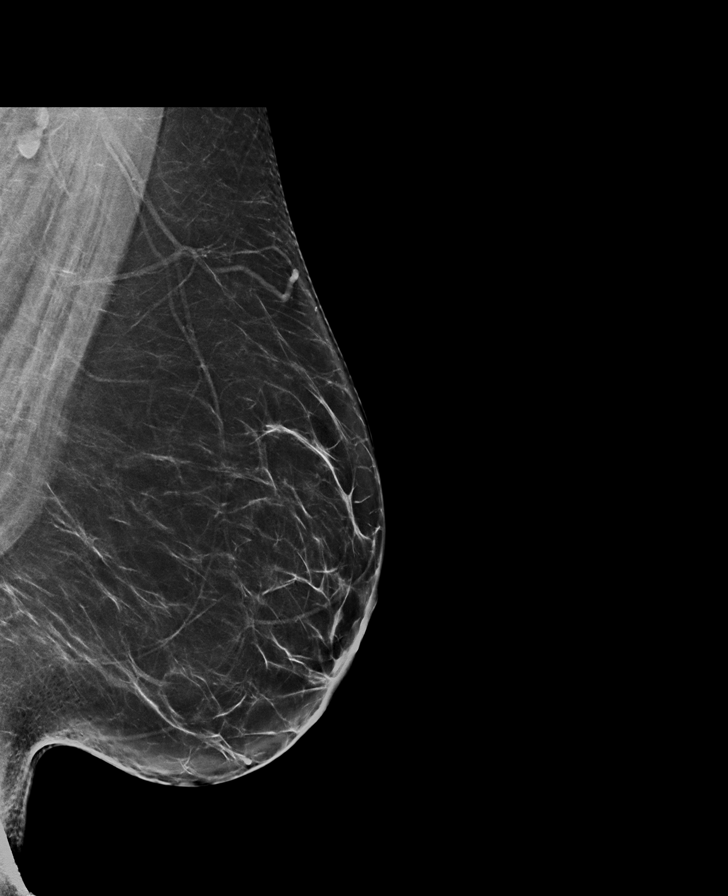

[L CC synth-2D]
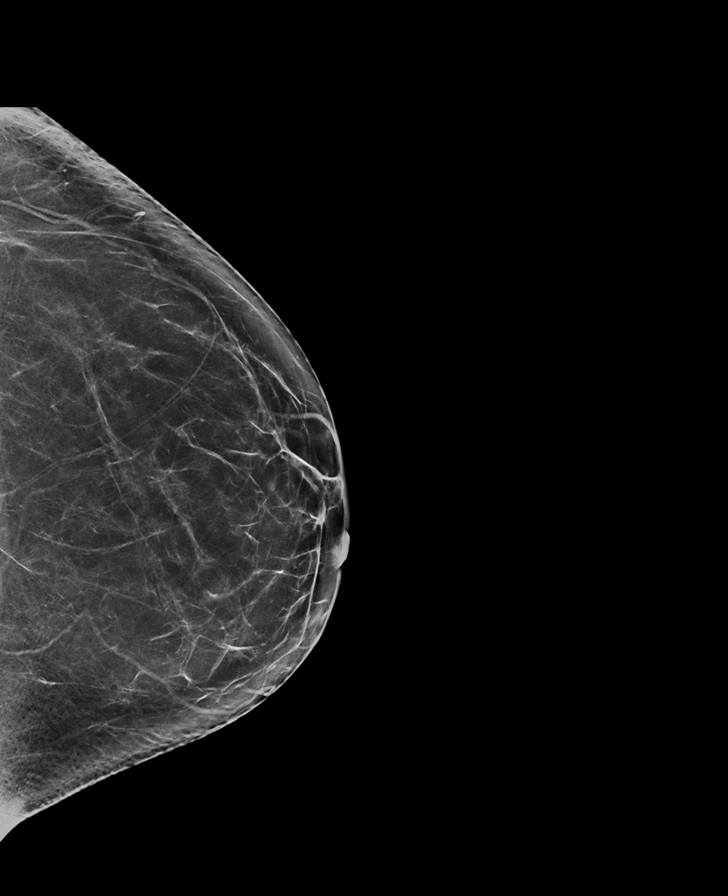

[R CC synth-2D]
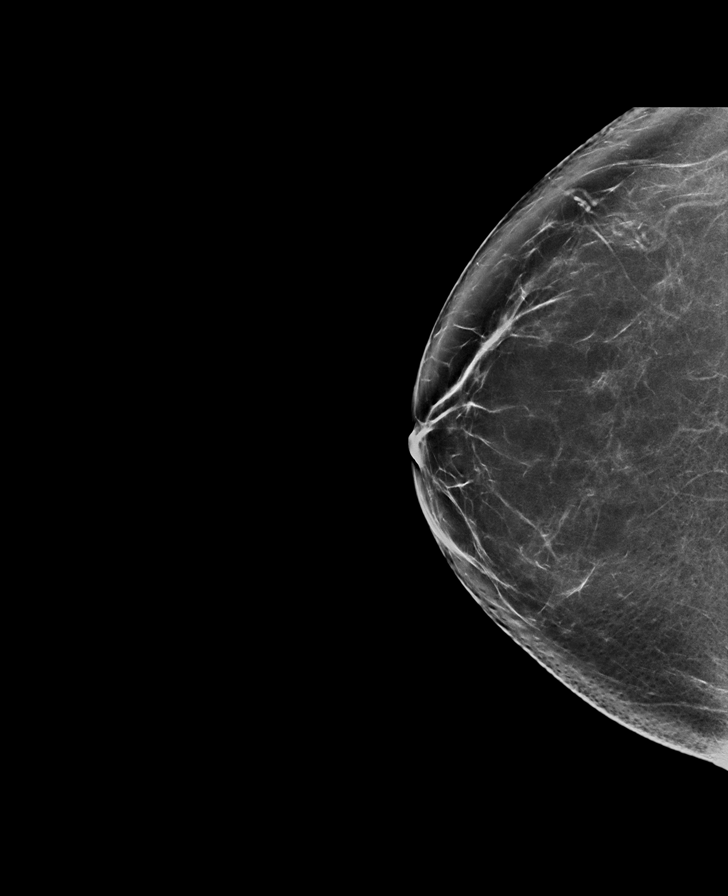

[R CC tomo · tomo slice 43/84.0]
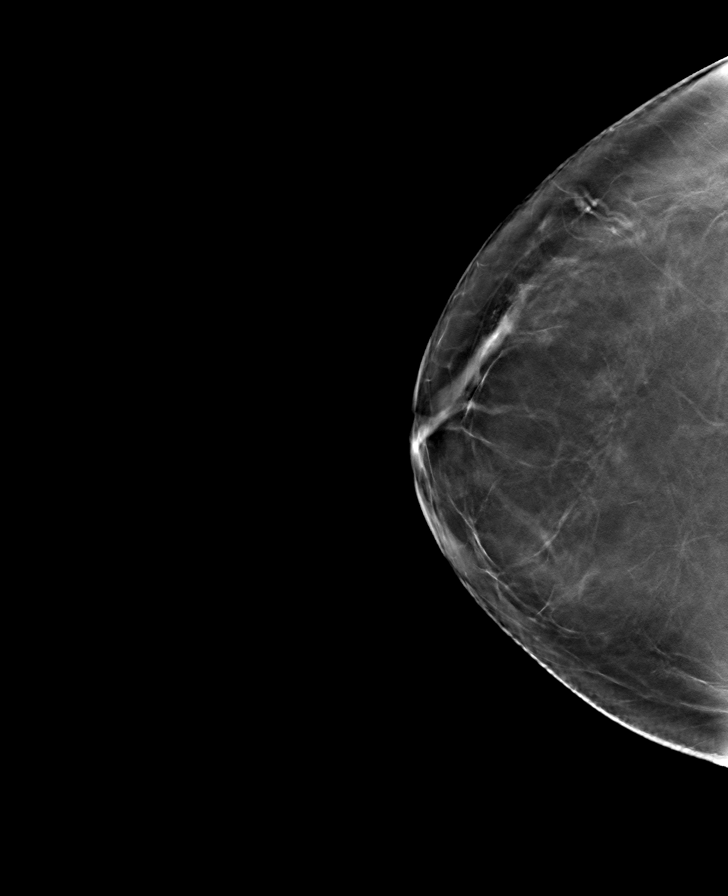

[L CC tomo · tomo slice 39/77.0]
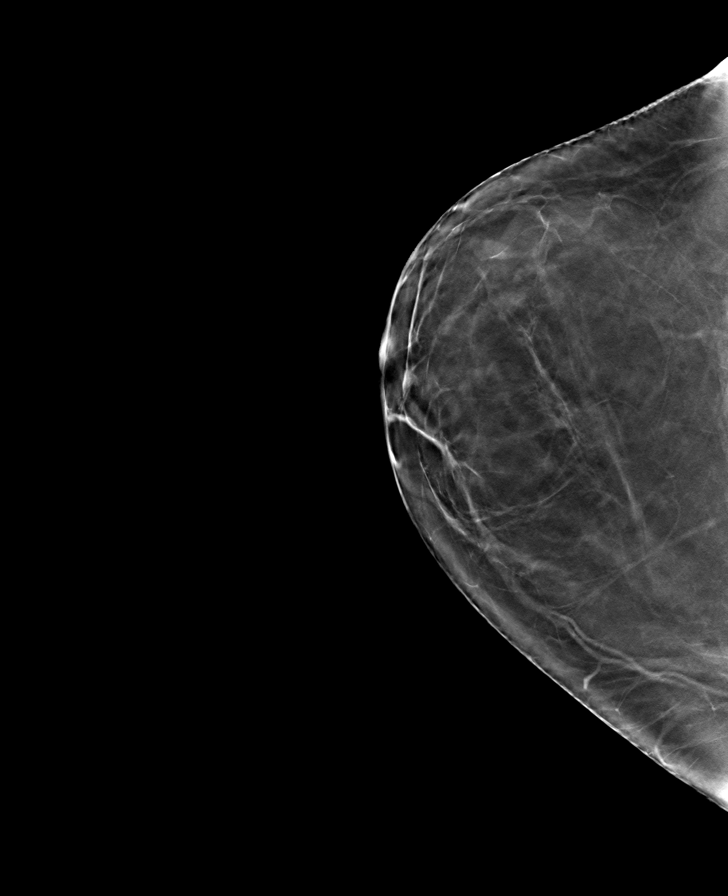

[L MLO tomo · tomo slice 43/84.0]
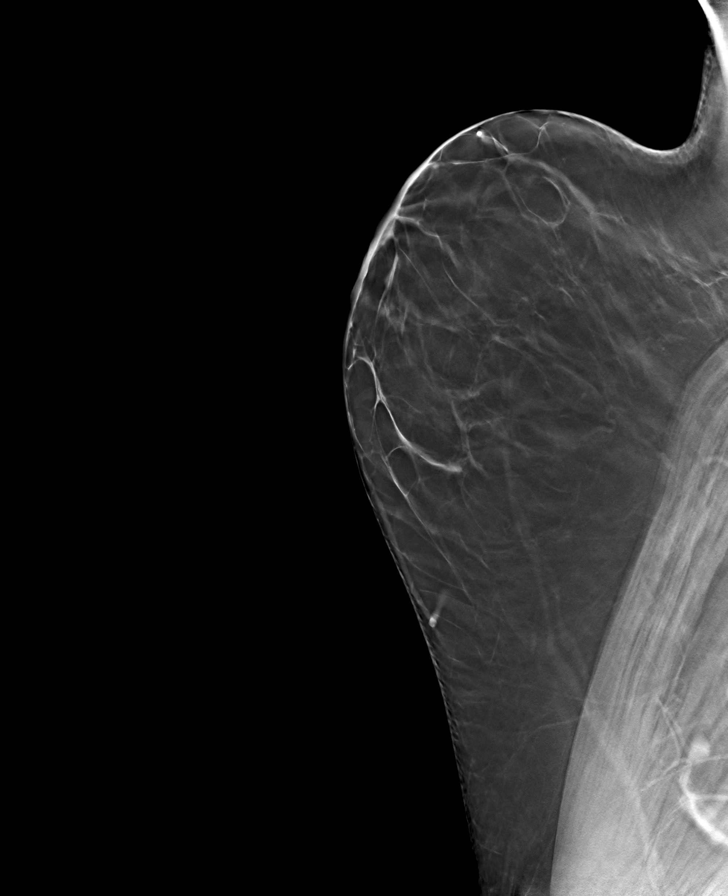

[R MLO tomo · tomo slice 40/79.0]
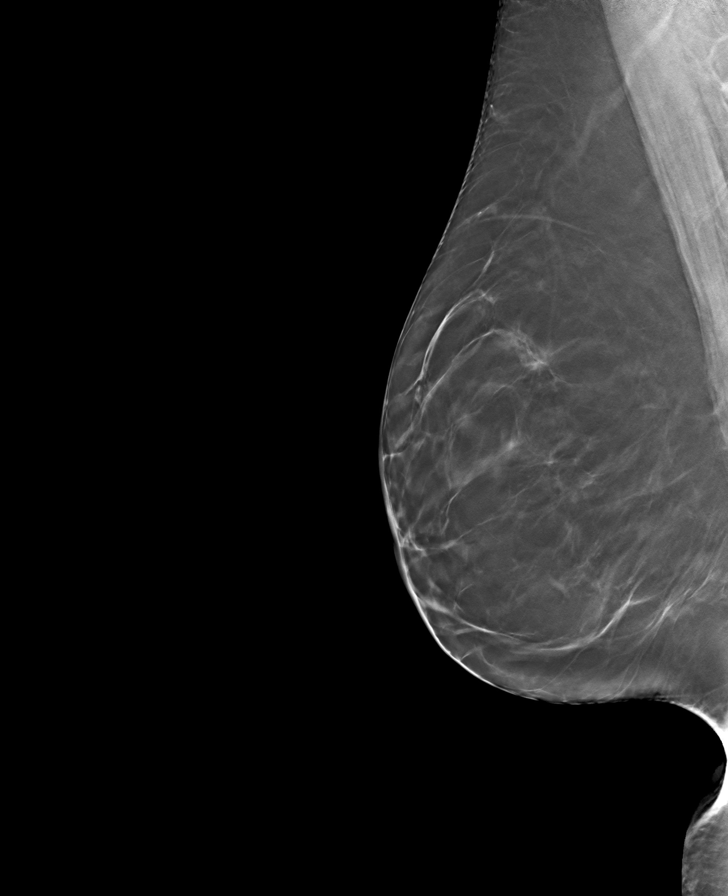

[8 of 24 positions shown; findings below may reference images not displayed]

FINDINGS: There are no findings suspicious for malignancy. Images were
processed with CAD.
IMPRESSION: No mammographic evidence of malignancy. A result letter of this
screening mammogram will be mailed directly to the patient.

RECOMMENDATION:
Screening mammogram in one year. (Code:[id number])

BI-RADS CATEGORY  1: Negative.

## 2021-12-18 ENCOUNTER — Ambulatory Visit
Admission: EM | Admit: 2021-12-18 | Discharge: 2021-12-18 | Disposition: A | Discharge status: Home / Self Care | Payer: Self-pay | Attending: Emergency Medicine | Admitting: Emergency Medicine

## 2021-12-18 ENCOUNTER — Encounter: Payer: Self-pay | Admitting: Emergency Medicine

## 2021-12-18 ENCOUNTER — Other Ambulatory Visit: Payer: Self-pay

## 2021-12-18 DIAGNOSIS — U071 COVID-19: Secondary | ICD-10-CM

## 2021-12-18 DIAGNOSIS — F17200 Nicotine dependence, unspecified, uncomplicated: Secondary | ICD-10-CM

## 2021-12-18 MED ORDER — MOLNUPIRAVIR EUA 200MG CAPSULE: 4.0000 | ORAL_CAPSULE | Freq: Two times a day (BID) | ORAL | 0 refills | Status: AC

## 2021-12-18 NOTE — ED Triage Notes (Signed)
Pt c/o cough, ST, runny nose, HA and chills. At home covid test was positive last night.

## 2021-12-18 NOTE — ED Provider Notes (Signed)
[facility]    CSN: [id number] Arrival date & time: 12/18/21  1103      History   Chief Complaint Chief Complaint  Patient presents with   Covid Positive    HPI Cynthia Mcguire is a 50 y.o. female.  Patient presents with low-grade fever, chills, body aches, headache, sore throat, cough, diarrhea since last night.  Positive at-home COVID test last night and again this morning.  She denies rash, shortness of breath, vomiting, or other symptoms.  Treatment at home with Tylenol.  Patient presents with Her medical history includes skin cancer, IBS, history of hepatitis C.  Current everyday smoker.  The history is provided by the patient and medical records.   Past Medical History:  Diagnosis Date   Cancer ([facility])    skin cancer s/p surgery   Hep C w/o coma, chronic ([facility])     Patient Active Problem List   Diagnosis Date Noted   IBS (irritable bowel syndrome) 10/05/2015   Hep C w/o coma, chronic ([facility]) 10/05/2015    Past Surgical History:  Procedure Laterality Date   CESAREAN SECTION     CHOLECYSTECTOMY      OB History   No obstetric history on file.      Home Medications    Prior to Admission medications   Medication Sig Start Date End Date Taking? Authorizing Provider  molnupiravir EUA (LAGEVRIO) 200 mg CAPS capsule Take 4 capsules (800 mg total) by mouth 2 (two) times daily for 5 days. 12/18/21 12/23/21 Yes Sharion Balloon, NP    Family History Family History  Problem Relation Age of Onset   Diabetes Father    Cancer Maternal Grandmother        lung   Stroke Maternal Grandmother    Hyperlipidemia Neg Hx    Hypertension Neg Hx    Breast cancer Neg Hx     Social History Social History   Tobacco Use   Smoking status: Every Day    Packs/day: 0.50    Years: 30.00    Pack years: 15.00    Types: Cigarettes    Start date: 12/24/1978   Smokeless tobacco: Never  Vaping Use   Vaping Use: Never used  Substance Use Topics   Alcohol use: Yes     Alcohol/week: 0.0 standard drinks   Drug use: No     Allergies   Patient has no known allergies.   Review of Systems Review of Systems  Constitutional:  Positive for chills and fever.  HENT:  Positive for sore throat. Negative for ear pain.   Respiratory:  Positive for cough. Negative for shortness of breath.   Cardiovascular:  Negative for chest pain and palpitations.  Gastrointestinal:  Positive for diarrhea. Negative for abdominal pain and vomiting.  Skin:  Negative for color change and rash.  Neurological:  Positive for headaches. Negative for syncope.  All other systems reviewed and are negative.   Physical Exam Triage Vital Signs ED Triage Vitals [12/18/21 1258]  Enc Vitals Group     BP      Pulse      Resp      Temp      Temp src      SpO2      Weight      Height      Head Circumference      Peak Flow      Pain Score 0     Pain Loc      Pain Edu?  Excl. in [place]?    No data found.  Updated Vital Signs BP 132/82 (BP Location: Left Arm)    Pulse 79    Temp 98.8 F (37.1 C) (Oral)    Resp 18    SpO2 98%   Visual Acuity Right Eye Distance:   Left Eye Distance:   Bilateral Distance:    Right Eye Near:   Left Eye Near:    Bilateral Near:     Physical Exam Vitals and nursing note reviewed.  Constitutional:      General: She is not in acute distress.    Appearance: Normal appearance. She is well-developed.  HENT:     Right Ear: Tympanic membrane normal.     Left Ear: Tympanic membrane normal.     Nose: Nose normal.     Mouth/Throat:     Mouth: Mucous membranes are moist.     Pharynx: Oropharynx is clear.  Cardiovascular:     Rate and Rhythm: Normal rate and regular rhythm.     Heart sounds: Normal heart sounds.  Pulmonary:     Effort: Pulmonary effort is normal. No respiratory distress.     Breath sounds: Normal breath sounds.  Abdominal:     Palpations: Abdomen is soft.     Tenderness: There is no abdominal tenderness.  Musculoskeletal:      Cervical back: Neck supple.  Skin:    General: Skin is warm and dry.  Neurological:     Mental Status: She is alert.  Psychiatric:        Mood and Affect: Mood normal.        Behavior: Behavior normal.     UC Treatments / Results  Labs (all labs ordered are listed, but only abnormal results are displayed) Labs Reviewed - No data to display  EKG   Radiology No results found.  Procedures Procedures (including critical care time)  Medications Ordered in UC Medications - No data to display  Initial Impression / Assessment and Plan / UC Course  I have reviewed the triage vital signs and the nursing notes.  Pertinent labs & imaging results that were available during my care of the patient were reviewed by me and considered in my medical decision making (see chart for details).   COVID-19.  Current everyday smoker.  Positive at-home COVID test x 2.  Treating with molnupiravir.  Discussed that this is an emergency authorized medication.  Discussed the side effects of molnupiravir, including dizziness, nausea, diarrhea.   Instructed patient to self quarantine per CDC guidelines.  ED precautions discussed.  Patient agrees to plan of care.    Final Clinical Impressions(s) / UC Diagnoses   Final diagnoses:  [id number]  Smoker     Discharge Instructions      Take the molnupiravir as directed.    You should self-quarantine according to the CDC guidelines.  See attached.    Most people do not need to be re-tested at the end of the quarantine period.    Go to the emergency department if you have high fever, shortness of breath, severe diarrhea, or other concerning symptoms.          ED Prescriptions     Medication Sig Dispense Auth. Provider   molnupiravir EUA (LAGEVRIO) 200 mg CAPS capsule Take 4 capsules (800 mg total) by mouth 2 (two) times daily for 5 days. 40 capsule Sharion Balloon, NP      PDMP not reviewed this encounter.   Sharion Balloon, NP 12/18/21  1347 ° °

## 2021-12-18 NOTE — Discharge Instructions (Addendum)
Take the molnupiravir as directed.    You should self-quarantine according to the CDC guidelines.  See attached.    Most people do not need to be re-tested at the end of the quarantine period.    Go to the emergency department if you have high fever, shortness of breath, severe diarrhea, or other concerning symptoms.

## 2023-01-15 ENCOUNTER — Other Ambulatory Visit: Payer: Self-pay

## 2023-01-15 ENCOUNTER — Ambulatory Visit (LOCAL_COMMUNITY_HEALTH_CENTER): Payer: Self-pay

## 2023-01-15 DIAGNOSIS — Z111 Encounter for screening for respiratory tuberculosis: Secondary | ICD-10-CM

## 2023-01-18 ENCOUNTER — Ambulatory Visit (LOCAL_COMMUNITY_HEALTH_CENTER): Payer: Self-pay

## 2023-01-18 DIAGNOSIS — Z111 Encounter for screening for respiratory tuberculosis: Secondary | ICD-10-CM

## 2023-01-18 LAB — TB SKIN TEST
Induration: 0 mm
TB Skin Test: NEGATIVE

## 2024-01-07 ENCOUNTER — Ambulatory Visit (LOCAL_COMMUNITY_HEALTH_CENTER): Payer: Self-pay

## 2024-01-07 DIAGNOSIS — Z111 Encounter for screening for respiratory tuberculosis: Secondary | ICD-10-CM

## 2024-01-10 ENCOUNTER — Ambulatory Visit (LOCAL_COMMUNITY_HEALTH_CENTER): Payer: Self-pay

## 2024-01-10 DIAGNOSIS — Z111 Encounter for screening for respiratory tuberculosis: Secondary | ICD-10-CM

## 2024-01-10 LAB — TB SKIN TEST
Induration: 0 mm
TB Skin Test: NEGATIVE

## 2025-01-01 ENCOUNTER — Ambulatory Visit (LOCAL_COMMUNITY_HEALTH_CENTER): Payer: Self-pay

## 2025-01-01 DIAGNOSIS — Z111 Encounter for screening for respiratory tuberculosis: Secondary | ICD-10-CM

## 2025-01-04 ENCOUNTER — Ambulatory Visit: Payer: Self-pay

## 2025-01-04 DIAGNOSIS — Z111 Encounter for screening for respiratory tuberculosis: Secondary | ICD-10-CM

## 2025-01-04 LAB — TB SKIN TEST
Induration: 0 mm
TB Skin Test: NEGATIVE
